# Patient Record
Sex: Female | Born: 1975
Health system: Southern US, Community
[De-identification: ages and names within clinical notes are randomized; demographics above are authoritative.]

## PROBLEM LIST (undated history)

## (undated) DIAGNOSIS — M5430 Sciatica, unspecified side: Secondary | ICD-10-CM

## (undated) DIAGNOSIS — F419 Anxiety disorder, unspecified: Secondary | ICD-10-CM

## (undated) DIAGNOSIS — E119 Type 2 diabetes mellitus without complications: Secondary | ICD-10-CM

## (undated) DIAGNOSIS — G43909 Migraine, unspecified, not intractable, without status migrainosus: Secondary | ICD-10-CM

## (undated) HISTORY — PX: CHOLECYSTECTOMY: SHX55

## (undated) HISTORY — PX: TONSILLECTOMY: SUR1361

## (undated) HISTORY — PX: ORTHOPEDIC SURGERY: SHX850

## (undated) HISTORY — PX: NOVASURE ABLATION: SHX5394

## (undated) HISTORY — PX: APPENDECTOMY: SHX54

---

## 1998-10-23 ENCOUNTER — Ambulatory Visit (HOSPITAL_COMMUNITY): Admission: RE | Admit: 1998-10-23 | Discharge: 1998-10-23 | Payer: Self-pay | Admitting: *Deleted

## 1998-11-19 ENCOUNTER — Inpatient Hospital Stay (HOSPITAL_COMMUNITY): Admission: AD | Admit: 1998-11-19 | Discharge: 1998-11-19 | Payer: Self-pay | Admitting: *Deleted

## 1998-11-23 ENCOUNTER — Ambulatory Visit (HOSPITAL_COMMUNITY): Admission: RE | Admit: 1998-11-23 | Discharge: 1998-11-23 | Payer: Self-pay | Admitting: *Deleted

## 1998-12-02 ENCOUNTER — Inpatient Hospital Stay (HOSPITAL_COMMUNITY): Admission: AD | Admit: 1998-12-02 | Discharge: 1998-12-04 | Payer: Self-pay | Admitting: Obstetrics

## 1998-12-09 ENCOUNTER — Inpatient Hospital Stay (HOSPITAL_COMMUNITY): Admission: AD | Admit: 1998-12-09 | Discharge: 1998-12-09 | Payer: Self-pay | Admitting: Obstetrics

## 1999-02-06 ENCOUNTER — Inpatient Hospital Stay (HOSPITAL_COMMUNITY): Admission: AD | Admit: 1999-02-06 | Discharge: 1999-02-06 | Payer: Self-pay | Admitting: *Deleted

## 1999-03-03 ENCOUNTER — Ambulatory Visit (HOSPITAL_COMMUNITY): Admission: RE | Admit: 1999-03-03 | Discharge: 1999-03-03 | Payer: Self-pay | Admitting: Obstetrics

## 1999-03-24 ENCOUNTER — Ambulatory Visit (HOSPITAL_COMMUNITY): Admission: RE | Admit: 1999-03-24 | Discharge: 1999-03-24 | Payer: Self-pay | Admitting: Obstetrics

## 1999-04-20 ENCOUNTER — Inpatient Hospital Stay (HOSPITAL_COMMUNITY): Admission: RE | Admit: 1999-04-20 | Discharge: 1999-04-20 | Payer: Self-pay | Admitting: Obstetrics

## 1999-04-23 ENCOUNTER — Inpatient Hospital Stay (HOSPITAL_COMMUNITY): Admission: AD | Admit: 1999-04-23 | Discharge: 1999-04-26 | Payer: Self-pay | Admitting: *Deleted

## 2000-08-23 ENCOUNTER — Encounter: Admission: RE | Admit: 2000-08-23 | Discharge: 2000-08-23 | Payer: Self-pay | Admitting: Family Medicine

## 2000-09-11 ENCOUNTER — Encounter: Admission: RE | Admit: 2000-09-11 | Discharge: 2000-09-19 | Payer: Self-pay | Admitting: Family Medicine

## 2001-06-13 ENCOUNTER — Emergency Department (HOSPITAL_COMMUNITY): Admission: EM | Admit: 2001-06-13 | Discharge: 2001-06-14 | Payer: Self-pay | Admitting: *Deleted

## 2001-06-14 ENCOUNTER — Encounter: Payer: Self-pay | Admitting: Emergency Medicine

## 2003-01-17 ENCOUNTER — Emergency Department (HOSPITAL_COMMUNITY): Admission: EM | Admit: 2003-01-17 | Discharge: 2003-01-17 | Payer: Self-pay | Admitting: Emergency Medicine

## 2003-01-17 ENCOUNTER — Encounter: Payer: Self-pay | Admitting: Emergency Medicine

## 2004-04-28 ENCOUNTER — Other Ambulatory Visit: Admission: RE | Admit: 2004-04-28 | Discharge: 2004-04-28 | Payer: Self-pay | Admitting: Obstetrics and Gynecology

## 2004-11-18 ENCOUNTER — Other Ambulatory Visit: Admission: RE | Admit: 2004-11-18 | Discharge: 2004-11-18 | Payer: Self-pay | Admitting: Obstetrics and Gynecology

## 2005-04-29 ENCOUNTER — Other Ambulatory Visit: Admission: RE | Admit: 2005-04-29 | Discharge: 2005-04-29 | Payer: Self-pay | Admitting: Obstetrics and Gynecology

## 2005-08-31 ENCOUNTER — Encounter: Admission: RE | Admit: 2005-08-31 | Discharge: 2005-08-31 | Payer: Self-pay | Admitting: Internal Medicine

## 2005-09-20 ENCOUNTER — Emergency Department (HOSPITAL_COMMUNITY): Admission: EM | Admit: 2005-09-20 | Discharge: 2005-09-20 | Payer: Self-pay | Admitting: Emergency Medicine

## 2005-11-09 ENCOUNTER — Other Ambulatory Visit: Admission: RE | Admit: 2005-11-09 | Discharge: 2005-11-09 | Payer: Self-pay | Admitting: Obstetrics and Gynecology

## 2005-12-28 ENCOUNTER — Encounter: Admission: RE | Admit: 2005-12-28 | Discharge: 2005-12-28 | Payer: Self-pay | Admitting: Family Medicine

## 2006-05-16 ENCOUNTER — Other Ambulatory Visit: Admission: RE | Admit: 2006-05-16 | Discharge: 2006-05-16 | Payer: Self-pay | Admitting: Obstetrics and Gynecology

## 2008-01-04 ENCOUNTER — Inpatient Hospital Stay (HOSPITAL_COMMUNITY): Admission: RE | Admit: 2008-01-04 | Discharge: 2008-01-06 | Payer: Self-pay | Admitting: Obstetrics and Gynecology

## 2009-12-15 ENCOUNTER — Encounter: Admission: RE | Admit: 2009-12-15 | Discharge: 2010-03-15 | Payer: Self-pay | Admitting: Obstetrics and Gynecology

## 2010-05-09 ENCOUNTER — Inpatient Hospital Stay (HOSPITAL_COMMUNITY): Admission: AD | Admit: 2010-05-09 | Discharge: 2010-05-11 | Payer: Self-pay | Admitting: Obstetrics and Gynecology

## 2010-08-18 ENCOUNTER — Emergency Department (HOSPITAL_BASED_OUTPATIENT_CLINIC_OR_DEPARTMENT_OTHER): Admission: EM | Admit: 2010-08-18 | Discharge: 2010-08-18 | Payer: Self-pay | Admitting: Emergency Medicine

## 2010-08-18 ENCOUNTER — Ambulatory Visit: Payer: Self-pay | Admitting: Diagnostic Radiology

## 2011-02-24 LAB — PREGNANCY, URINE: Preg Test, Ur: NEGATIVE

## 2011-02-24 LAB — CBC
HCT: 40.4 % (ref 36.0–46.0)
MCH: 29.7 pg (ref 26.0–34.0)
MCV: 86.5 fL (ref 78.0–100.0)
Platelets: 238 10*3/uL (ref 150–400)
RDW: 12.3 % (ref 11.5–15.5)
WBC: 8.6 10*3/uL (ref 4.0–10.5)

## 2011-02-24 LAB — URINALYSIS, ROUTINE W REFLEX MICROSCOPIC
Glucose, UA: NEGATIVE mg/dL
Ketones, ur: NEGATIVE mg/dL
Protein, ur: NEGATIVE mg/dL
Urobilinogen, UA: 0.2 mg/dL (ref 0.0–1.0)

## 2011-02-24 LAB — URINE CULTURE: Colony Count: 50000

## 2011-02-24 LAB — DIFFERENTIAL
Basophils Absolute: 0.1 10*3/uL (ref 0.0–0.1)
Eosinophils Absolute: 0.2 10*3/uL (ref 0.0–0.7)
Eosinophils Relative: 2 % (ref 0–5)
Lymphocytes Relative: 31 % (ref 12–46)
Lymphs Abs: 2.7 10*3/uL (ref 0.7–4.0)
Monocytes Absolute: 0.8 10*3/uL (ref 0.1–1.0)

## 2011-02-24 LAB — URINE MICROSCOPIC-ADD ON

## 2011-02-28 LAB — GLUCOSE, RANDOM: Glucose, Bld: 107 mg/dL — ABNORMAL HIGH (ref 70–99)

## 2011-02-28 LAB — CBC
HCT: 34.6 % — ABNORMAL LOW (ref 36.0–46.0)
Hemoglobin: 11.9 g/dL — ABNORMAL LOW (ref 12.0–15.0)
Hemoglobin: 14.4 g/dL (ref 12.0–15.0)
RBC: 3.78 MIL/uL — ABNORMAL LOW (ref 3.87–5.11)
RBC: 4.54 MIL/uL (ref 3.87–5.11)
WBC: 14.1 10*3/uL — ABNORMAL HIGH (ref 4.0–10.5)

## 2011-02-28 LAB — RPR: RPR Ser Ql: NONREACTIVE

## 2011-04-26 NOTE — Discharge Summary (Signed)
Margaret Wright, Margaret Wright          ACCOUNT NO.:  [id number]   MEDICAL RECORD NO.:  [id number]          PATIENT TYPE:  INP   LOCATION:  9131                          FACILITY:  [facility]   PHYSICIAN:  Zenaida Niece, M.D.DATE OF BIRTH:  Apr 02, 1976   DATE OF ADMISSION:  01/04/2008  DATE OF DISCHARGE:  01/06/2008                               DISCHARGE SUMMARY   ADMISSION DIAGNOSES:  1. Intrauterine pregnancy at 39 weeks.  2. Possible pregnancy-induced hypertension.  3. Obesity.   DISCHARGE DIAGNOSES:  1. Intrauterine pregnancy at 39 weeks.  2. Possible pregnancy-induced hypertension.  3. Fetal macrosomia.  4. Obesity.   PROCEDURES:  On January 23, she had a spontaneous vaginal delivery.   HISTORY AND PHYSICAL:  This is a 35 year old white female, gravida 3,  para 2-0-0-2 with an EGA of 39+ weeks who was admitted for elective  induction.  Prenatal care complicated by back pain, treated with  Flexeril and Darvocet, and recent slightly elevated blood pressures with  normal labs.   PRENATAL LABORATORY DATA:  Blood type is O+ with a negative antibody  screen, RPR nonreactive, rubella immune, hepatitis B surface antigen  negative, HIV negative, gonorrhea and chlamydia negative.  TSH is  normal. One-hour Glucola 193, GTT 79, 126, 169, 107; and group B strep  is negative.   PAST OB HISTORY:  Two vaginal deliveries at term.  The first baby  weighed 10 pounds 6 ounces and had a shoulder dystocia. The second baby  weighed 8 pounds 13 ounces without complications.   PAST MEDICAL HISTORY:  1. Asthma.  2. Remote history of hypothyroidism.  3. Anxiety.  4. Migraines.  5. Back pain.  6. Constipation.   PAST SURGICAL HISTORY:  1. Right knee surgery.  2. Left wrist surgery.  3. Tonsillectomy.  4. Appendectomy.  5. Cholecystectomy.   ALLERGIES:  ASPIRIN.   CURRENT MEDICATIONS:  Flexeril, Darvocet, and albuterol.   PHYSICAL EXAMINATION:  VITAL SIGNS:  She is afebrile with stable  vital  signs. Blood pressure on admission is normal. Weight is approximately  320 pounds.  Fetal heart tracing is reactive with irregular  contractions.  ABDOMEN:  Gravid, nontender with an estimated fetal weight of 8-9  pounds.  PELVIC:  Cervix is 2-3, 30, -3, vertex presentation.   HOSPITAL COURSE:  The patient was admitted and put on Pitocin for  induction.  She progressed into labor and received an epidural.  She  progressed to 4 cm at which time she had amniotomy which revealed clear  fluid.  She progressed to complete, pushed well, and on the evening of  January 23 had a vaginal delivery of a viable female infant with Apgars of  8/9, weight 10 pounds 6 ounces.  There was no significant shoulder  dystocia.  Placenta delivered spontaneously and was intact with a three-  vessel cord and was sent for cord blood collection.  She had a second-  degree laceration repaired with 3-0 Vicryl with local block, and  estimated blood loss was 500 mL.  Postpartum, she had no significant  complications.  Predelivery hemoglobin 13.1, postdelivery 11.7.  On  postoperative #2, she  was felt to be stable enough for discharge home.   DISCHARGE INSTRUCTIONS:  1. Regular diet.  2. Pelvic rest.  3. Follow up in 6 weeks.   DISCHARGE MEDICATIONS:  1. Percocet #20, one to two p.o. q. 4-6 h p.r.n. pain.  2. Over-the-counter ibuprofen as needed.   She is given our discharge pamphlet.      Zenaida Niece, M.D.  Electronically Signed     TDM/MEDQ  D:  01/06/2008  T:  01/06/2008  Job:  [id number]

## 2011-09-02 LAB — CBC
HCT: 33.5 — ABNORMAL LOW
HCT: 38
Hemoglobin: 13.1
MCHC: 34.6
MCHC: 35
MCV: 87
MCV: 87.6
Platelets: 182
RBC: 4.34
RDW: 13
RDW: 13.1

## 2013-01-22 ENCOUNTER — Other Ambulatory Visit: Payer: Self-pay | Admitting: Sports Medicine

## 2013-01-22 DIAGNOSIS — M5430 Sciatica, unspecified side: Secondary | ICD-10-CM

## 2013-01-22 DIAGNOSIS — M545 Low back pain, unspecified: Secondary | ICD-10-CM

## 2013-01-25 ENCOUNTER — Other Ambulatory Visit: Payer: Self-pay

## 2013-01-26 ENCOUNTER — Ambulatory Visit
Admission: RE | Admit: 2013-01-26 | Discharge: 2013-01-26 | Disposition: A | Discharge status: Home / Self Care | Payer: Federal, State, Local not specified - PPO | Source: Ambulatory Visit | Attending: Sports Medicine | Admitting: Sports Medicine

## 2013-01-26 DIAGNOSIS — M545 Low back pain, unspecified: Secondary | ICD-10-CM

## 2013-01-26 DIAGNOSIS — M5430 Sciatica, unspecified side: Secondary | ICD-10-CM

## 2013-07-09 ENCOUNTER — Other Ambulatory Visit: Payer: Self-pay | Admitting: Nurse Practitioner

## 2014-02-25 ENCOUNTER — Emergency Department (HOSPITAL_BASED_OUTPATIENT_CLINIC_OR_DEPARTMENT_OTHER): Payer: Federal, State, Local not specified - PPO

## 2014-02-25 ENCOUNTER — Encounter (HOSPITAL_BASED_OUTPATIENT_CLINIC_OR_DEPARTMENT_OTHER): Payer: Self-pay | Admitting: Emergency Medicine

## 2014-02-25 ENCOUNTER — Emergency Department (HOSPITAL_BASED_OUTPATIENT_CLINIC_OR_DEPARTMENT_OTHER)
Admission: EM | Admit: 2014-02-25 | Discharge: 2014-02-25 | Disposition: A | Discharge status: Home / Self Care | Payer: Federal, State, Local not specified - PPO | Attending: Emergency Medicine | Admitting: Emergency Medicine

## 2014-02-25 DIAGNOSIS — F411 Generalized anxiety disorder: Secondary | ICD-10-CM | POA: Insufficient documentation

## 2014-02-25 DIAGNOSIS — Z8679 Personal history of other diseases of the circulatory system: Secondary | ICD-10-CM | POA: Insufficient documentation

## 2014-02-25 DIAGNOSIS — IMO0002 Reserved for concepts with insufficient information to code with codable children: Secondary | ICD-10-CM | POA: Insufficient documentation

## 2014-02-25 DIAGNOSIS — Y929 Unspecified place or not applicable: Secondary | ICD-10-CM | POA: Insufficient documentation

## 2014-02-25 DIAGNOSIS — Z79899 Other long term (current) drug therapy: Secondary | ICD-10-CM | POA: Insufficient documentation

## 2014-02-25 DIAGNOSIS — W010XXA Fall on same level from slipping, tripping and stumbling without subsequent striking against object, initial encounter: Secondary | ICD-10-CM | POA: Insufficient documentation

## 2014-02-25 DIAGNOSIS — E119 Type 2 diabetes mellitus without complications: Secondary | ICD-10-CM | POA: Insufficient documentation

## 2014-02-25 DIAGNOSIS — M5416 Radiculopathy, lumbar region: Secondary | ICD-10-CM

## 2014-02-25 DIAGNOSIS — Y939 Activity, unspecified: Secondary | ICD-10-CM | POA: Insufficient documentation

## 2014-02-25 HISTORY — DX: Migraine, unspecified, not intractable, without status migrainosus: G43.909

## 2014-02-25 HISTORY — DX: Type 2 diabetes mellitus without complications: E11.9

## 2014-02-25 HISTORY — DX: Anxiety disorder, unspecified: F41.9

## 2014-02-25 MED ORDER — HYDROCODONE-ACETAMINOPHEN 5-325 MG PO TABS: 1.0000 | ORAL_TABLET | Freq: Four times a day (QID) | ORAL | Status: DC | PRN

## 2014-02-25 MED ORDER — HYDROMORPHONE HCL PF 2 MG/ML IJ SOLN
2.0000 mg | Freq: Once | INTRAMUSCULAR | Status: AC
  Administered 2014-02-25: 2 mg via INTRAMUSCULAR
  Filled 2014-02-25: qty 1

## 2014-02-25 NOTE — ED Notes (Signed)
Mopping slipped on wet floor  Fell    Back pain

## 2014-02-25 NOTE — ED Provider Notes (Signed)
CSN: [id number]     Arrival date & time 02/25/14  0100 History   First MD Initiated Contact with Patient 02/25/14 0249     Chief Complaint  Patient presents with  . Back Injury     (Consider location/radiation/quality/duration/timing/severity/associated sxs/prior Treatment) HPI This is a 38 year old female who was mopping the floor about 10 PM yesterday evening and slipped on a wet spot. She fell injuring her back. She is having moderate to severe pain in her right lumbar region radiating down her right leg and about the L3 dermatome. There is no associated numbness or weakness. The pain is worse with movement of the right hip. She denies other injury.  Past Medical History  Diagnosis Date  . Diabetes mellitus without complication   . Anxiety   . Migraines    Past Surgical History  Procedure Laterality Date  . Tonsillectomy    . Appendectomy    . Cholecystectomy    . Orthopedic surgery     No family history on file. History  Substance Use Topics  . Smoking status: Never Smoker   . Smokeless tobacco: Not on file  . Alcohol Use: Yes     Comment: socially   OB History   Grav Para Term Preterm Abortions TAB SAB Ect Mult Living                 Review of Systems  All other systems reviewed and are negative.   Allergies  Aspirin  Home Medications   Current Outpatient Rx  Name  Route  Sig  Dispense  Refill  . ALPRAZolam (XANAX) 0.5 MG tablet   Oral   Take 0.5 mg by mouth at bedtime as needed for anxiety.         Marland Kitchen. buPROPion (WELLBUTRIN XL) 300 MG 24 hr tablet   Oral   Take 300 mg by mouth daily.         . butalbital-acetaminophen-caffeine (FIORICET WITH CODEINE) 50-325-40-30 MG per capsule   Oral   Take 1 capsule by mouth every 4 (four) hours as needed for headache.         . metFORMIN (GLUCOPHAGE) 500 MG tablet   Oral   Take 500 mg by mouth 2 (two) times daily with a meal.         . methocarbamol (ROBAXIN) 750 MG tablet   Oral   Take 750 mg by  mouth 4 (four) times daily.         . sertraline (ZOLOFT) 100 MG tablet   Oral   Take 150 mg by mouth daily.          BP 136/74  Pulse 86  Temp(Src) 98.3 F (36.8 C) (Oral)  Resp 20  Ht 5\' 8"  (1.727 m)  Wt 289 lb (131.09 kg)  BMI 43.95 kg/m2  SpO2 100%  Physical Exam General: Well-developed, obese female in no acute distress; appearance consistent with age of record HENT: normocephalic; atraumatic Eyes: pupils equal, round and reactive to light; extraocular muscles intact Neck: supple Heart: regular rate and rhythm; no murmurs, rubs or gallops Lungs: clear to auscultation bilaterally Abdomen: soft; nondistended Back: Right lower back tenderness; pain on straight leg raise on the right at about 30 Extremities: No deformity; full range of motion except for right hip due to pain; pulses normal Neurologic: Awake, alert and oriented; motor function intact in all extremities and symmetric; no facial droop; sensation intact and equal in lower extremities Skin: Warm and dry Psychiatric: Mildly anxious  ED Course  Procedures (including critical care time)   MDM  Nursing notes and vitals signs, including pulse oximetry, reviewed.  Summary of this visit's results, reviewed by myself:  Labs:  No results found for this or any previous visit (from the past 24 hour(s)).  Imaging Studies: Dg Lumbar Spine Complete  02/25/2014   CLINICAL DATA:  Injury.  Mid and low back pain.  EXAM: LUMBAR SPINE - COMPLETE 4+ VIEW  COMPARISON:  MRI lumbar spine 01/26/2013.  FINDINGS: Vertebral body height and alignment are normal. Intervertebral disc space height is maintained. Cholecystectomy clips are noted.  IMPRESSION: Negative exam.   Electronically Signed   By: Drusilla Kanner M.D.   On: 02/25/2014 03:40       Hanley Seamen, MD 02/25/14 [phone]

## 2014-02-25 NOTE — ED Notes (Signed)
Slipped on wet floor  C/o pain from mid to lower back  Radiating to rt hip into rt leg  No loc

## 2014-04-25 ENCOUNTER — Encounter (HOSPITAL_BASED_OUTPATIENT_CLINIC_OR_DEPARTMENT_OTHER): Payer: Self-pay | Admitting: Emergency Medicine

## 2014-04-25 ENCOUNTER — Emergency Department (HOSPITAL_BASED_OUTPATIENT_CLINIC_OR_DEPARTMENT_OTHER)
Admission: EM | Admit: 2014-04-25 | Discharge: 2014-04-25 | Disposition: A | Discharge status: Home / Self Care | Payer: Federal, State, Local not specified - PPO | Attending: Emergency Medicine | Admitting: Emergency Medicine

## 2014-04-25 DIAGNOSIS — F411 Generalized anxiety disorder: Secondary | ICD-10-CM | POA: Insufficient documentation

## 2014-04-25 DIAGNOSIS — Z888 Allergy status to other drugs, medicaments and biological substances status: Secondary | ICD-10-CM | POA: Insufficient documentation

## 2014-04-25 DIAGNOSIS — E119 Type 2 diabetes mellitus without complications: Secondary | ICD-10-CM | POA: Insufficient documentation

## 2014-04-25 DIAGNOSIS — M5430 Sciatica, unspecified side: Secondary | ICD-10-CM

## 2014-04-25 DIAGNOSIS — G43909 Migraine, unspecified, not intractable, without status migrainosus: Secondary | ICD-10-CM

## 2014-04-25 DIAGNOSIS — G43009 Migraine without aura, not intractable, without status migrainosus: Secondary | ICD-10-CM | POA: Insufficient documentation

## 2014-04-25 DIAGNOSIS — Z79899 Other long term (current) drug therapy: Secondary | ICD-10-CM | POA: Insufficient documentation

## 2014-04-25 HISTORY — DX: Sciatica, unspecified side: M54.30

## 2014-04-25 MED ORDER — HYDROCODONE-ACETAMINOPHEN 5-325 MG PO TABS: 1.0000 | ORAL_TABLET | Freq: Four times a day (QID) | ORAL | Status: DC | PRN

## 2014-04-25 MED ORDER — KETOROLAC TROMETHAMINE 60 MG/2ML IM SOLN
60.0000 mg | Freq: Once | INTRAMUSCULAR | Status: AC
  Administered 2014-04-25: 60 mg via INTRAMUSCULAR
  Filled 2014-04-25: qty 2

## 2014-04-25 MED ORDER — ONDANSETRON 8 MG PO TBDP
8.0000 mg | ORAL_TABLET | Freq: Once | ORAL | Status: AC
Start: 2014-04-25 — End: 2014-04-25
  Administered 2014-04-25: 8 mg via ORAL
  Filled 2014-04-25: qty 1

## 2014-04-25 NOTE — ED Notes (Addendum)
Pt reports migraine HA that began yesterday approx midnight - pt has hx of migraines and has taken her fioricet w/o relief. Pt admits to nausea, denies vomiting, fever or stiff neck. Pt also w/ photophobia. Lights dimmed for pt comfort.

## 2014-04-25 NOTE — ED Notes (Signed)
Pt ambulating independently w/ steady gait on d/c in no acute distress, A&Ox4. D/c instructions reviewed w/ pt and family - pt and family deny any further questions or concerns at present. Rx given x1  

## 2014-04-25 NOTE — ED Provider Notes (Signed)
CSN: [id number]     Arrival date & time 04/25/14  0012 History   First MD Initiated Contact with Patient 04/25/14 [phone]     Chief Complaint  Patient presents with  . Migraine     (Consider location/radiation/quality/duration/timing/severity/associated sxs/prior Treatment) HPI This is a 38 year old female with a history of sciatica migraines. Her sciatica started "acting up" 3 days ago. She believes that that triggered a migraine. The onset was about 28 hours ago. She has taken four Fioricet capsules without relief. Her pain on arrival was 10 out of 10 but is now about 8/10. It is located on the right side of her head, is described as throbbing and is like previous migraines. There is associated photophobia and nausea but no vomiting. There is no focal neurologic deficits. She does have blurred vision.  Past Medical History  Diagnosis Date  . Diabetes mellitus without complication   . Anxiety   . Migraines    Past Surgical History  Procedure Laterality Date  . Tonsillectomy    . Appendectomy    . Cholecystectomy    . Orthopedic surgery     No family history on file. History  Substance Use Topics  . Smoking status: Never Smoker   . Smokeless tobacco: Not on file  . Alcohol Use: Yes     Comment: socially   OB History   Grav Para Term Preterm Abortions TAB SAB Ect Mult Living                 Review of Systems  All other systems reviewed and are negative.   Allergies  Aspirin  Home Medications   Prior to Admission medications   Medication Sig Start Date End Date Taking? Authorizing Provider  ALPRAZolam Prudy Feeler(XANAX) 0.5 MG tablet Take 0.5 mg by mouth at bedtime as needed for anxiety.    Historical Provider, MD  buPROPion (WELLBUTRIN XL) 300 MG 24 hr tablet Take 300 mg by mouth daily.    Historical Provider, MD  butalbital-acetaminophen-caffeine (FIORICET WITH CODEINE) 50-325-40-30 MG per capsule Take 1 capsule by mouth every 4 (four) hours as needed for headache.    Historical  Provider, MD  HYDROcodone-acetaminophen (NORCO/VICODIN) 5-325 MG per tablet Take 1-2 tablets by mouth every 6 (six) hours as needed (for pain). 02/25/14   Carlisle BeersJohn L Gissel Keilman, MD  metFORMIN (GLUCOPHAGE) 500 MG tablet Take 500 mg by mouth 2 (two) times daily with a meal.    Historical Provider, MD  methocarbamol (ROBAXIN) 750 MG tablet Take 750 mg by mouth 4 (four) times daily.    Historical Provider, MD  sertraline (ZOLOFT) 100 MG tablet Take 150 mg by mouth daily.    Historical Provider, MD   BP 124/76  Pulse 68  Temp(Src) 98 F (36.7 C) (Oral)  Resp 16  SpO2 100%  Physical Exam General: Well-developed, well-nourished female in no acute distress; appearance consistent with age of record HENT: normocephalic; atraumatic Eyes: pupils equal, round and reactive to light; extraocular muscles intact; photophobia Neck: supple Heart: regular rate and rhythm Lungs: clear to auscultation bilaterally Abdomen: soft; nondistended Back: Pain on movement of lower back Extremities: No deformity; full range of motion Neurologic: Sleeping but readily aroused; motor function intact in all extremities and symmetric; no facial droop Skin: Warm and dry Psychiatric: Normal mood and affect    ED Course  Procedures (including critical care time)  MDM  4:19 AM Patient's headache significantly improved after IM Toradol.    Hanley SeamenJohn L Zymier Rodgers, MD 04/25/14 [phone]

## 2014-04-25 NOTE — ED Notes (Signed)
C/o migraine x 1 day with photosensitivity, blurry vision and nausea. Pain 10/10. Believes the migraine was triggered by her sciatica flare up

## 2014-06-26 ENCOUNTER — Emergency Department (HOSPITAL_BASED_OUTPATIENT_CLINIC_OR_DEPARTMENT_OTHER)
Admission: EM | Admit: 2014-06-26 | Discharge: 2014-06-26 | Disposition: A | Discharge status: Home / Self Care | Payer: Federal, State, Local not specified - PPO | Attending: Emergency Medicine | Admitting: Emergency Medicine

## 2014-06-26 ENCOUNTER — Encounter (HOSPITAL_BASED_OUTPATIENT_CLINIC_OR_DEPARTMENT_OTHER): Payer: Self-pay | Admitting: Emergency Medicine

## 2014-06-26 DIAGNOSIS — F17200 Nicotine dependence, unspecified, uncomplicated: Secondary | ICD-10-CM | POA: Insufficient documentation

## 2014-06-26 DIAGNOSIS — G43909 Migraine, unspecified, not intractable, without status migrainosus: Secondary | ICD-10-CM | POA: Insufficient documentation

## 2014-06-26 DIAGNOSIS — Z79899 Other long term (current) drug therapy: Secondary | ICD-10-CM | POA: Insufficient documentation

## 2014-06-26 DIAGNOSIS — F411 Generalized anxiety disorder: Secondary | ICD-10-CM | POA: Insufficient documentation

## 2014-06-26 DIAGNOSIS — B958 Unspecified staphylococcus as the cause of diseases classified elsewhere: Secondary | ICD-10-CM | POA: Insufficient documentation

## 2014-06-26 DIAGNOSIS — L089 Local infection of the skin and subcutaneous tissue, unspecified: Secondary | ICD-10-CM

## 2014-06-26 DIAGNOSIS — Z8739 Personal history of other diseases of the musculoskeletal system and connective tissue: Secondary | ICD-10-CM | POA: Insufficient documentation

## 2014-06-26 DIAGNOSIS — E119 Type 2 diabetes mellitus without complications: Secondary | ICD-10-CM | POA: Insufficient documentation

## 2014-06-26 MED ORDER — MUPIROCIN CALCIUM 2 % EX CREA
TOPICAL_CREAM | Freq: Once | CUTANEOUS | Status: AC
  Administered 2014-06-26: 03:00:00 via TOPICAL
  Filled 2014-06-26: qty 15

## 2014-06-26 MED ORDER — DOXYCYCLINE HYCLATE 100 MG PO CAPS: 100.0000 mg | ORAL_CAPSULE | Freq: Two times a day (BID) | ORAL | Status: DC

## 2014-06-26 MED ORDER — FENTANYL CITRATE 0.05 MG/ML IJ SOLN
100.0000 ug | Freq: Once | INTRAMUSCULAR | Status: AC
  Administered 2014-06-26: 100 ug via INTRAVENOUS
  Filled 2014-06-26: qty 2

## 2014-06-26 MED ORDER — DOXYCYCLINE HYCLATE 100 MG PO TABS
100.0000 mg | ORAL_TABLET | Freq: Once | ORAL | Status: AC
  Administered 2014-06-26: 100 mg via ORAL
  Filled 2014-06-26: qty 1

## 2014-06-26 MED ORDER — HYDROCODONE-IBUPROFEN 7.5-200 MG PO TABS: 1.0000 | ORAL_TABLET | Freq: Four times a day (QID) | ORAL | Status: DC | PRN

## 2014-06-26 MED ORDER — VANCOMYCIN HCL IN DEXTROSE 1-5 GM/200ML-% IV SOLN
1000.0000 mg | Freq: Once | INTRAVENOUS | Status: AC
Start: 2014-06-26 — End: 2014-06-26
  Administered 2014-06-26: 1000 mg via INTRAVENOUS
  Filled 2014-06-26: qty 200

## 2014-06-26 NOTE — ED Provider Notes (Signed)
CSN: [id number]     Arrival date & time 06/26/14  0231 History   First MD Initiated Contact with Patient 06/26/14 0302     Chief Complaint  Patient presents with  . Wound Infection     (Consider location/radiation/quality/duration/timing/severity/associated sxs/prior Treatment) HPI This is a 38 year old female who had a tattoo to her left lateral thigh done 2 weeks ago. About 5 days ago she believes she was bitten by insects on her legs and has subsequently developed pustules around the new tattoo. She has attempted to drain the pustule herself the patient continued to be "very painful". There is some associated drainage. She bumped her left leg earlier, startling herself, which caused her to twist it exacerbated her chronic right sciatica.  Past Medical History  Diagnosis Date  . Diabetes mellitus without complication   . Anxiety   . Migraines   . Sciatica    Past Surgical History  Procedure Laterality Date  . Tonsillectomy    . Appendectomy    . Cholecystectomy    . Orthopedic surgery    . Novasure ablation     No family history on file. History  Substance Use Topics  . Smoking status: Current Every Day Smoker -- 1.00 packs/day  . Smokeless tobacco: Not on file  . Alcohol Use: Yes     Comment: socially   OB History   Grav Para Term Preterm Abortions TAB SAB Ect Mult Living                 Review of Systems  All other systems reviewed and are negative.   Allergies  Aspirin and Morphine and related  Home Medications   Prior to Admission medications   Medication Sig Start Date End Date Taking? Authorizing Provider  gabapentin (NEURONTIN) 100 MG capsule Take 100 mg by mouth 3 (three) times daily.   Yes Historical Provider, MD  ALPRAZolam Prudy Feeler(XANAX) 0.5 MG tablet Take 0.5 mg by mouth at bedtime as needed for anxiety.    Historical Provider, MD  buPROPion (WELLBUTRIN XL) 300 MG 24 hr tablet Take 300 mg by mouth daily.    Historical Provider, MD    butalbital-acetaminophen-caffeine (FIORICET WITH CODEINE) 50-325-40-30 MG per capsule Take 1 capsule by mouth every 4 (four) hours as needed for headache.    Historical Provider, MD  HYDROcodone-acetaminophen (NORCO/VICODIN) 5-325 MG per tablet Take 1-2 tablets by mouth every 6 (six) hours as needed (for pain). 04/25/14   Carlisle BeersJohn L Taneshia Lorence, MD  metFORMIN (GLUCOPHAGE) 500 MG tablet Take 500 mg by mouth 2 (two) times daily with a meal.    Historical Provider, MD  methocarbamol (ROBAXIN) 750 MG tablet Take 750 mg by mouth 4 (four) times daily.    Historical Provider, MD  sertraline (ZOLOFT) 100 MG tablet Take 150 mg by mouth daily.    Historical Provider, MD   BP 144/72  Pulse 98  Temp(Src) 99.2 F (37.3 C) (Oral)  Resp 20  Ht 5\' 8"  (1.727 m)  Wt 297 lb (134.718 kg)  BMI 45.17 kg/m2  SpO2 98%  Physical Exam General: Well-developed, well-nourished female in no acute distress; appearance consistent with age of record HENT: normocephalic; atraumatic Eyes: pupils equal, round and reactive to light; extraocular muscles intact Neck: supple Heart: regular rate and rhythm Lungs: clear to auscultation bilaterally Abdomen: soft; nondistended Extremities: No deformity; full range of motion; pulses normal Neurologic: Awake, alert and oriented; motor function intact in all extremities and symmetric; no facial droop Skin: Warm and dry; tattoo left  lateral calf with surrounding tender pustules as shown:  Psychiatric: Normal mood and affect    ED Course  Procedures (including critical care time)  MDM  3:12 AM We'll give vancomycin 1 g in the ED and then sent out on topical mupirocin and oral doxycycline.    Hanley Seamen, MD 06/26/14 [phone]

## 2014-06-26 NOTE — ED Notes (Signed)
Pt got a new tatoo on left lower leg 2 weeks ago today.  Redness and severe pain around the area. Also twisted in such a way tonight to cause pain in her lower back.

## 2014-11-11 ENCOUNTER — Emergency Department (HOSPITAL_COMMUNITY)
Admission: EM | Admit: 2014-11-11 | Discharge: 2014-11-11 | Disposition: A | Discharge status: Home / Self Care | Payer: Federal, State, Local not specified - PPO | Attending: Emergency Medicine | Admitting: Emergency Medicine

## 2014-11-11 ENCOUNTER — Emergency Department (HOSPITAL_COMMUNITY): Payer: Federal, State, Local not specified - PPO

## 2014-11-11 ENCOUNTER — Encounter (HOSPITAL_COMMUNITY): Payer: Self-pay | Admitting: *Deleted

## 2014-11-11 DIAGNOSIS — G43909 Migraine, unspecified, not intractable, without status migrainosus: Secondary | ICD-10-CM | POA: Diagnosis not present

## 2014-11-11 DIAGNOSIS — F419 Anxiety disorder, unspecified: Secondary | ICD-10-CM | POA: Insufficient documentation

## 2014-11-11 DIAGNOSIS — R05 Cough: Secondary | ICD-10-CM | POA: Diagnosis present

## 2014-11-11 DIAGNOSIS — R059 Cough, unspecified: Secondary | ICD-10-CM

## 2014-11-11 DIAGNOSIS — J209 Acute bronchitis, unspecified: Secondary | ICD-10-CM

## 2014-11-11 DIAGNOSIS — Z79899 Other long term (current) drug therapy: Secondary | ICD-10-CM | POA: Diagnosis not present

## 2014-11-11 DIAGNOSIS — Z8739 Personal history of other diseases of the musculoskeletal system and connective tissue: Secondary | ICD-10-CM | POA: Insufficient documentation

## 2014-11-11 DIAGNOSIS — E119 Type 2 diabetes mellitus without complications: Secondary | ICD-10-CM | POA: Diagnosis not present

## 2014-11-11 DIAGNOSIS — Z720 Tobacco use: Secondary | ICD-10-CM | POA: Diagnosis not present

## 2014-11-11 DIAGNOSIS — Z7951 Long term (current) use of inhaled steroids: Secondary | ICD-10-CM | POA: Insufficient documentation

## 2014-11-11 DIAGNOSIS — M62838 Other muscle spasm: Secondary | ICD-10-CM | POA: Insufficient documentation

## 2014-11-11 DIAGNOSIS — H9209 Otalgia, unspecified ear: Secondary | ICD-10-CM | POA: Insufficient documentation

## 2014-11-11 MED ORDER — PREDNISONE 20 MG PO TABS: 20.0000 mg | ORAL_TABLET | Freq: Two times a day (BID) | ORAL | Status: DC

## 2014-11-11 MED ORDER — IPRATROPIUM BROMIDE 0.02 % IN SOLN: 0.5000 mg | Freq: Once | RESPIRATORY_TRACT | Status: DC

## 2014-11-11 MED ORDER — ALBUTEROL SULFATE (2.5 MG/3ML) 0.083% IN NEBU: 5.0000 mg | INHALATION_SOLUTION | Freq: Once | RESPIRATORY_TRACT | Status: DC

## 2014-11-11 MED ORDER — PREDNISONE 10 MG PO TABS
60.0000 mg | ORAL_TABLET | Freq: Once | ORAL | Status: AC
  Administered 2014-11-11: 60 mg via ORAL
  Filled 2014-11-11 (×2): qty 1

## 2014-11-11 MED ORDER — PREDNISONE 10 MG PO TABS: 10.0000 mg | ORAL_TABLET | Freq: Every day | ORAL | Status: DC

## 2014-11-11 MED ORDER — IPRATROPIUM-ALBUTEROL 0.5-2.5 (3) MG/3ML IN SOLN
3.0000 mL | Freq: Once | RESPIRATORY_TRACT | Status: AC
  Administered 2014-11-11: 3 mL via RESPIRATORY_TRACT
  Filled 2014-11-11: qty 3

## 2014-11-11 MED ORDER — ALBUTEROL SULFATE (2.5 MG/3ML) 0.083% IN NEBU
2.5000 mg | INHALATION_SOLUTION | Freq: Once | RESPIRATORY_TRACT | Status: AC
  Administered 2014-11-11: 2.5 mg via RESPIRATORY_TRACT
  Filled 2014-11-11: qty 3

## 2014-11-11 MED ORDER — HYDROCODONE-ACETAMINOPHEN 5-325 MG PO TABS
1.0000 | ORAL_TABLET | Freq: Once | ORAL | Status: AC
  Administered 2014-11-11: 1 via ORAL
  Filled 2014-11-11: qty 1

## 2014-11-11 NOTE — ED Notes (Signed)
Pt alert & oriented x4, stable gait. Patient given discharge instructions, paperwork & prescription(s). Patient stopped at the registration desk to finish any additional paperwork. Patient  verbalized understanding. Pt left department in wheelchair w/ no further questions.

## 2014-11-11 NOTE — ED Provider Notes (Signed)
CSN: [id number]     Arrival date & time 11/11/14  0025 History  This chart was scribe for Flint MelterElliott L Brandalynn Ofallon, MD by Angelene GiovanniEmmanuella Mensah, ED Scribe. The patient was seen in room APA06/[facility]06 and the patient's care was started at 12:51 AM.    Chief Complaint  Patient presents with  . Cough   The history is provided by the patient. No language interpreter was used.   HPI Comments: Margaret Wright is a 38 y.o. female who presents to the Emergency Department complaining of a non-productive cough for 2 days. She reports associated ear pain, CP when she breathes in, and nasal congestion. She states that she smokes. She reports a hx of asthma and she states that she has an inhaler but it has not been helping.    Past Medical History  Diagnosis Date  . Diabetes mellitus without complication   . Anxiety   . Migraines   . Sciatica    Past Surgical History  Procedure Laterality Date  . Tonsillectomy    . Appendectomy    . Cholecystectomy    . Orthopedic surgery    . Novasure ablation     No family history on file. History  Substance Use Topics  . Smoking status: Current Every Day Smoker -- 1.00 packs/day  . Smokeless tobacco: Not on file  . Alcohol Use: Yes     Comment: socially   OB History    No data available     Review of Systems  HENT: Positive for congestion.   All other systems reviewed and are negative.     Allergies  Aspirin and Morphine and related  Home Medications   Prior to Admission medications   Medication Sig Start Date End Date Taking? Authorizing Provider  albuterol (PROVENTIL HFA;VENTOLIN HFA) 108 (90 BASE) MCG/ACT inhaler Inhale into the lungs every 6 (six) hours as needed for wheezing or shortness of breath.   Yes Historical Provider, MD  ALPRAZolam Prudy Feeler(XANAX) 0.5 MG tablet Take 0.5 mg by mouth 2 (two) times daily.    Yes Historical Provider, MD  buPROPion (WELLBUTRIN XL) 300 MG 24 hr tablet Take 300 mg by mouth daily.   Yes Historical Provider, MD   butalbital-acetaminophen-caffeine (FIORICET WITH CODEINE) 50-325-40-30 MG per capsule Take 1 capsule by mouth every 4 (four) hours as needed for headache.   Yes Historical Provider, MD  carisoprodol (SOMA) 350 MG tablet Take 350 mg by mouth 4 (four) times daily.   Yes Historical Provider, MD  enalapril (VASOTEC) 2.5 MG tablet Take 2.5 mg by mouth daily.   Yes Historical Provider, MD  fluticasone (FLONASE) 50 MCG/ACT nasal spray Place into both nostrils daily.   Yes Historical Provider, MD  gabapentin (NEURONTIN) 100 MG capsule Take 100 mg by mouth 3 (three) times daily.   Yes Historical Provider, MD  HYDROcodone-acetaminophen (NORCO/VICODIN) 5-325 MG per tablet Take 1-2 tablets by mouth every 6 (six) hours as needed (for pain). 04/25/14  Yes John L Molpus, MD  metFORMIN (GLUCOPHAGE) 500 MG tablet Take 500 mg by mouth 2 (two) times daily with a meal.   Yes Historical Provider, MD  promethazine (PHENERGAN) 25 MG tablet Take 25 mg by mouth every 6 (six) hours as needed for nausea or vomiting.   Yes Historical Provider, MD  sertraline (ZOLOFT) 100 MG tablet Take 150 mg by mouth daily.   Yes Historical Provider, MD  doxycycline (VIBRAMYCIN) 100 MG capsule Take 1 capsule (100 mg total) by mouth 2 (two) times daily. 06/26/14  John L Molpus, MD  HYDROcodone-ibuprofen (VICOPROFEN) 7.5-200 MG per tablet Take 1 tablet by mouth every 6 (six) hours as needed (for pain). 06/26/14   John L Molpus, MD  methocarbamol (ROBAXIN) 750 MG tablet Take 750 mg by mouth 4 (four) times daily.    Historical Provider, MD   BP 141/81 mmHg  Pulse 89  Temp(Src) 99.2 F (37.3 C)  Resp 20  Ht 5\' 8"  (1.727 m)  Wt 286 lb (129.729 kg)  BMI 43.50 kg/m2  SpO2 100% Physical Exam  Constitutional: She appears well-developed and well-nourished.  Cardiovascular: Normal rate, regular rhythm and normal heart sounds.   Pulmonary/Chest: She has no wheezes. She has no rales.  Shallow aspirations  Nursing note and vitals reviewed.   ED  Course  Procedures (including critical care time) Medications - No data to display  Patient Vitals for the past 24 hrs:  BP Temp Pulse Resp SpO2 Height Weight  11/11/14 0036 141/81 mmHg 99.2 F (37.3 C) 89 20 100 % 5\' 8"  (1.727 m) 286 lb (129.729 kg)    12:54 AM- Pt advised of plan for treatment and pt agrees.   2:03 AM Reevaluation with update and discussion. After initial assessment and treatment, an updated evaluation reveals she states breathing is better but is having muscle aches and spasms. Norco ordered. Will observe 30 min., and see if needs another neb treatment. Brannan Cassedy L   02:45- She is feeling better, and is ready to go home. Findings discussed with pt., and husband.  Labs Review Labs Reviewed - No data to display  Imaging Review No results found.   EKG Interpretation None      MDM   Final diagnoses:  Cough  Acute bronchitis, unspecified organism  Tobacco abuse   Evaluation is consistent with bronchitis related to tobacco abuse.  She has a nonproductive cough, and has improved to her baseline with a single nebulizer treatment.   Nursing Notes Reviewed/ Care Coordinated Applicable Imaging Reviewed Interpretation of Laboratory Data incorporated into ED treatment  The patient appears reasonably screened and/or stabilized for discharge and I doubt any other medical condition or other [facility] requiring further screening, evaluation, or treatment in the ED at this time prior to discharge.  Plan: Home Medications- Prednisone; Home Treatments- Stop smoking; return here if the recommended treatment, does not improve the symptoms; Recommended follow up- PCP prn  I personally performed the services described in this documentation, which was scribed in my presence. The recorded information has been reviewed and is accurate.    Flint MelterElliott L Philip Kotlyar, MD 11/11/14 [phone]

## 2014-11-11 NOTE — ED Notes (Signed)
Pt ambulated to restroom & returned to room w/ no complications. 

## 2014-11-11 NOTE — Discharge Instructions (Signed)
Acute Bronchitis Bronchitis is inflammation of the airways that extend from the windpipe into the lungs (bronchi). The inflammation often causes mucus to develop. This leads to a cough, which is the most common symptom of bronchitis.  In acute bronchitis, the condition usually develops suddenly and goes away over time, usually in a couple weeks. Smoking, allergies, and asthma can make bronchitis worse. Repeated episodes of bronchitis may cause further lung problems.  CAUSES Acute bronchitis is most often caused by the same virus that causes a cold. The virus can spread from person to person (contagious) through coughing, sneezing, and touching contaminated objects. SIGNS AND SYMPTOMS   Cough.   Fever.   Coughing up mucus.   Body aches.   Chest congestion.   Chills.   Shortness of breath.   Sore throat.  DIAGNOSIS  Acute bronchitis is usually diagnosed through a physical exam. Your health care provider will also ask you questions about your medical history. Tests, such as chest X-rays, are sometimes done to rule out other conditions.  TREATMENT  Acute bronchitis usually goes away in a couple weeks. Oftentimes, no medical treatment is necessary. Medicines are sometimes given for relief of fever or cough. Antibiotic medicines are usually not needed but may be prescribed in certain situations. In some cases, an inhaler may be recommended to help reduce shortness of breath and control the cough. A cool mist vaporizer may also be used to help thin bronchial secretions and make it easier to clear the chest.  HOME CARE INSTRUCTIONS  Get plenty of rest.   Drink enough fluids to keep your urine clear or pale yellow (unless you have a medical condition that requires fluid restriction). Increasing fluids may help thin your respiratory secretions (sputum) and reduce chest congestion, and it will prevent dehydration.   Take medicines only as directed by your health care provider.  If  you were prescribed an antibiotic medicine, finish it all even if you start to feel better.  Avoid smoking and secondhand smoke. Exposure to cigarette smoke or irritating chemicals will make bronchitis worse. If you are a smoker, consider using nicotine gum or skin patches to help control withdrawal symptoms. Quitting smoking will help your lungs heal faster.   Reduce the chances of another bout of acute bronchitis by washing your hands frequently, avoiding people with cold symptoms, and trying not to touch your hands to your mouth, nose, or eyes.   Keep all follow-up visits as directed by your health care provider.  SEEK MEDICAL CARE IF: Your symptoms do not improve after 1 week of treatment.  SEEK IMMEDIATE MEDICAL CARE IF:  You develop an increased fever or chills.   You have chest pain.   You have severe shortness of breath.  You have bloody sputum.   You develop dehydration.  You faint or repeatedly feel like you are going to pass out.  You develop repeated vomiting.  You develop a severe headache. MAKE SURE YOU:   Understand these instructions.  Will watch your condition.  Will get help right away if you are not doing well or get worse. Document Released: 01/05/2005 Document Revised: 04/14/2014 Document Reviewed: 05/21/2013 [facility] Patient Information 2015 [place], [place]. This information is not intended to replace advice given to you by your health care provider. Make sure you discuss any questions you have with your health care provider.  Smoking Cessation, Tips for Success If you are ready to quit smoking, congratulations! You have chosen to help yourself be healthier. Cigarettes bring  nicotine, tar, carbon monoxide, and other irritants into your body. Your lungs, heart, and blood vessels will be able to work better without these poisons. There are many different ways to quit smoking. Nicotine gum, nicotine patches, a nicotine inhaler, or nicotine nasal spray can  help with physical craving. Hypnosis, support groups, and medicines help break the habit of smoking. WHAT THINGS CAN I DO TO MAKE QUITTING EASIER?  Here are some tips to help you quit for good:  Pick a date when you will quit smoking completely. Tell all of your friends and family about your plan to quit on that date.  Do not try to slowly cut down on the number of cigarettes you are smoking. Pick a quit date and quit smoking completely starting on that day.  Throw away all cigarettes.   Clean and remove all ashtrays from your home, work, and car.  On a card, write down your reasons for quitting. Carry the card with you and read it when you get the urge to smoke.  Cleanse your body of nicotine. Drink enough water and fluids to keep your urine clear or pale yellow. Do this after quitting to flush the nicotine from your body.  Learn to predict your moods. Do not let a bad situation be your excuse to have a cigarette. Some situations in your life might tempt you into wanting a cigarette.  Never have "just one" cigarette. It leads to wanting another and another. Remind yourself of your decision to quit.  Change habits associated with smoking. If you smoked while driving or when feeling stressed, try other activities to replace smoking. Stand up when drinking your coffee. Brush your teeth after eating. Sit in a different chair when you read the paper. Avoid alcohol while trying to quit, and try to drink fewer caffeinated beverages. Alcohol and caffeine may urge you to smoke.  Avoid foods and drinks that can trigger a desire to smoke, such as sugary or spicy foods and alcohol.  Ask people who smoke not to smoke around you.  Have something planned to do right after eating or having a cup of coffee. For example, plan to take a walk or exercise.  Try a relaxation exercise to calm you down and decrease your stress. Remember, you may be tense and nervous for the first 2 weeks after you quit, but  this will pass.  Find new activities to keep your hands busy. Play with a pen, coin, or rubber band. Doodle or draw things on paper.  Brush your teeth right after eating. This will help cut down on the craving for the taste of tobacco after meals. You can also try mouthwash.   Use oral substitutes in place of cigarettes. Try using lemon drops, carrots, cinnamon sticks, or chewing gum. Keep them handy so they are available when you have the urge to smoke.  When you have the urge to smoke, try deep breathing.  Designate your home as a nonsmoking area.  If you are a heavy smoker, ask your health care provider about a prescription for nicotine chewing gum. It can ease your withdrawal from nicotine.  Reward yourself. Set aside the cigarette money you save and buy yourself something nice.  Look for support from others. Join a support group or smoking cessation program. Ask someone at home or at work to help you with your plan to quit smoking.  Always ask yourself, "Do I need this cigarette or is this just a reflex?" Tell yourself, "Today, I  choose not to smoke," or "I do not want to smoke." You are reminding yourself of your decision to quit.  Do not replace cigarette smoking with electronic cigarettes (commonly called e-cigarettes). The safety of e-cigarettes is unknown, and some may contain harmful chemicals.  If you relapse, do not give up! Plan ahead and think about what you will do the next time you get the urge to smoke. HOW WILL I FEEL WHEN I QUIT SMOKING? You may have symptoms of withdrawal because your body is used to nicotine (the addictive substance in cigarettes). You may crave cigarettes, be irritable, feel very hungry, cough often, get headaches, or have difficulty concentrating. The withdrawal symptoms are only temporary. They are strongest when you first quit but will go away within 10-14 days. When withdrawal symptoms occur, stay in control. Think about your reasons for quitting.  Remind yourself that these are signs that your body is healing and getting used to being without cigarettes. Remember that withdrawal symptoms are easier to treat than the major diseases that smoking can cause.  Even after the withdrawal is over, expect periodic urges to smoke. However, these cravings are generally short lived and will go away whether you smoke or not. Do not smoke! WHAT RESOURCES ARE AVAILABLE TO HELP ME QUIT SMOKING? Your health care provider can direct you to community resources or hospitals for support, which may include:  Group support.  Education.  Hypnosis.  Therapy. Document Released: 08/26/2004 Document Revised: 04/14/2014 Document Reviewed: 05/16/2013 [facility] Patient Information 2015 [place], [place]. This information is not intended to replace advice given to you by your health care provider. Make sure you discuss any questions you have with your health care provider.  Smoking Cessation Quitting smoking is important to your health and has many advantages. However, it is not always easy to quit since nicotine is a very addictive drug. Oftentimes, people try 3 times or more before being able to quit. This document explains the best ways for you to prepare to quit smoking. Quitting takes hard work and a lot of effort, but you can do it. ADVANTAGES OF QUITTING SMOKING  You will live longer, feel better, and live better.  Your body will feel the impact of quitting smoking almost immediately.  Within 20 minutes, blood pressure decreases. Your pulse returns to its normal level.  After 8 hours, carbon monoxide levels in the blood return to normal. Your oxygen level increases.  After 24 hours, the chance of having a heart attack starts to decrease. Your breath, hair, and body stop smelling like smoke.  After 48 hours, damaged nerve endings begin to recover. Your sense of taste and smell improve.  After 72 hours, the body is virtually free of nicotine. Your bronchial  tubes relax and breathing becomes easier.  After 2 to 12 weeks, lungs can hold more air. Exercise becomes easier and circulation improves.  The risk of having a heart attack, stroke, cancer, or lung disease is greatly reduced.  After 1 year, the risk of coronary heart disease is cut in half.  After 5 years, the risk of stroke falls to the same as a nonsmoker.  After 10 years, the risk of lung cancer is cut in half and the risk of other cancers decreases significantly.  After 15 years, the risk of coronary heart disease drops, usually to the level of a nonsmoker.  If you are pregnant, quitting smoking will improve your chances of having a healthy baby.  The people you live with, especially  any children, will be healthier.  You will have extra money to spend on things other than cigarettes. QUESTIONS TO THINK ABOUT BEFORE ATTEMPTING TO QUIT You may want to talk about your answers with your health care provider.  Why do you want to quit?  If you tried to quit in the past, what helped and what did not?  What will be the most difficult situations for you after you quit? How will you plan to handle them?  Who can help you through the tough times? Your family? Friends? A health care provider?  What pleasures do you get from smoking? What ways can you still get pleasure if you quit? Here are some questions to ask your health care provider:  How can you help me to be successful at quitting?  What medicine do you think would be best for me and how should I take it?  What should I do if I need more help?  What is smoking withdrawal like? How can I get information on withdrawal? GET READY  Set a quit date.  Change your environment by getting rid of all cigarettes, ashtrays, matches, and lighters in your home, car, or work. Do not let people smoke in your home.  Review your past attempts to quit. Think about what worked and what did not. GET SUPPORT AND ENCOURAGEMENT You have a  better chance of being successful if you have help. You can get support in many ways.  Tell your family, friends, and coworkers that you are going to quit and need their support. Ask them not to smoke around you.  Get individual, group, or telephone counseling and support. Programs are available at [employer] and health centers. Call your local health department for information about programs in your area.  Spiritual beliefs and practices may help some smokers quit.  Download a "quit meter" on your computer to keep track of quit statistics, such as how long you have gone without smoking, cigarettes not smoked, and money saved.  Get a self-help book about quitting smoking and staying off tobacco. LEARN NEW SKILLS AND BEHAVIORS  Distract yourself from urges to smoke. Talk to someone, go for a walk, or occupy your time with a task.  Change your normal routine. Take a different route to work. Drink tea instead of coffee. Eat breakfast in a different place.  Reduce your stress. Take a hot bath, exercise, or read a book.  Plan something enjoyable to do every day. Reward yourself for not smoking.  Explore interactive web-based programs that specialize in helping you quit. GET MEDICINE AND USE IT CORRECTLY Medicines can help you stop smoking and decrease the urge to smoke. Combining medicine with the above behavioral methods and support can greatly increase your chances of successfully quitting smoking.  Nicotine replacement therapy helps deliver nicotine to your body without the negative effects and risks of smoking. Nicotine replacement therapy includes nicotine gum, lozenges, inhalers, nasal sprays, and skin patches. Some may be available over-the-counter and others require a prescription.  Antidepressant medicine helps people abstain from smoking, but how this works is unknown. This medicine is available by prescription.  Nicotinic receptor partial agonist medicine simulates the effect  of nicotine in your brain. This medicine is available by prescription. Ask your health care provider for advice about which medicines to use and how to use them based on your health history. Your health care provider will tell you what side effects to look out for if you choose to be on  a medicine or therapy. Carefully read the information on the package. Do not use any other product containing nicotine while using a nicotine replacement product.  RELAPSE OR DIFFICULT SITUATIONS Most relapses occur within the first 3 months after quitting. Do not be discouraged if you start smoking again. Remember, most people try several times before finally quitting. You may have symptoms of withdrawal because your body is used to nicotine. You may crave cigarettes, be irritable, feel very hungry, cough often, get headaches, or have difficulty concentrating. The withdrawal symptoms are only temporary. They are strongest when you first quit, but they will go away within 10-14 days. To reduce the chances of relapse, try to:  Avoid drinking alcohol. Drinking lowers your chances of successfully quitting.  Reduce the amount of caffeine you consume. Once you quit smoking, the amount of caffeine in your body increases and can give you symptoms, such as a rapid heartbeat, sweating, and anxiety.  Avoid smokers because they can make you want to smoke.  Do not let weight gain distract you. Many smokers will gain weight when they quit, usually less than 10 pounds. Eat a healthy diet and stay active. You can always lose the weight gained after you quit.  Find ways to improve your mood other than smoking. FOR MORE INFORMATION  www.smokefree.gov  Document Released: 11/22/2001 Document Revised: 04/14/2014 Document Reviewed: 03/08/2012 [facility] Patient Information 2015 [place]. This information is not intended to replace advice given to you by your health care provider. Make sure you discuss any questions you have with  your health care provider.

## 2014-11-11 NOTE — ED Notes (Signed)
Pt reports cough that stated last night & nasal congestion. Pt says has had fluid on her ears also.

## 2015-10-31 IMAGING — CR DG LUMBAR SPINE COMPLETE 4+V
5 series · 5 of 5 positions shown · non-contrast
Comparison: MRI lumbar spine 01/26/2013.

CLINICAL DATA: Injury.  Mid and low back pain.

EXAM:
LUMBAR SPINE - COMPLETE 4+ VIEW

[t l-spine a.p.]
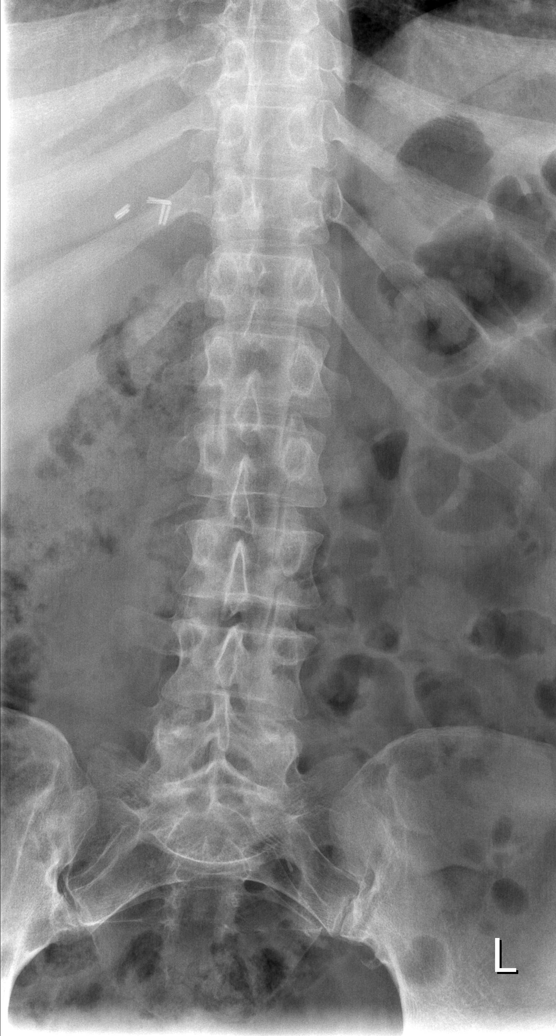

[t l-spine oblique exposure (1 of 2)]
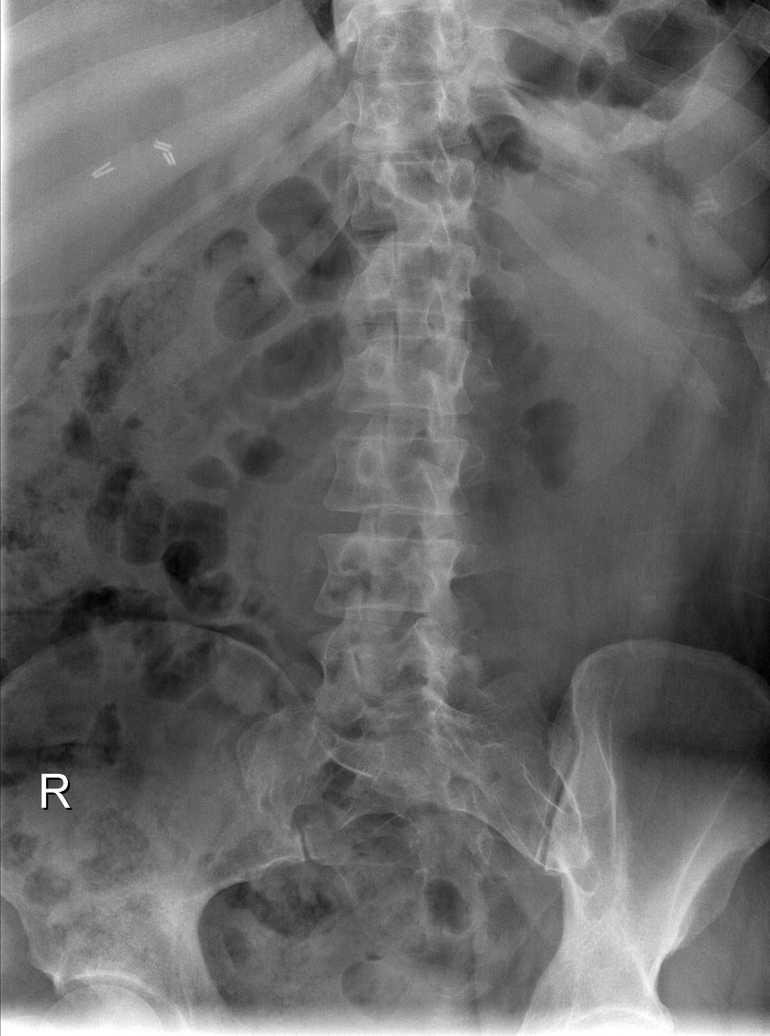

[t l-spine oblique exposure (2 of 2)]
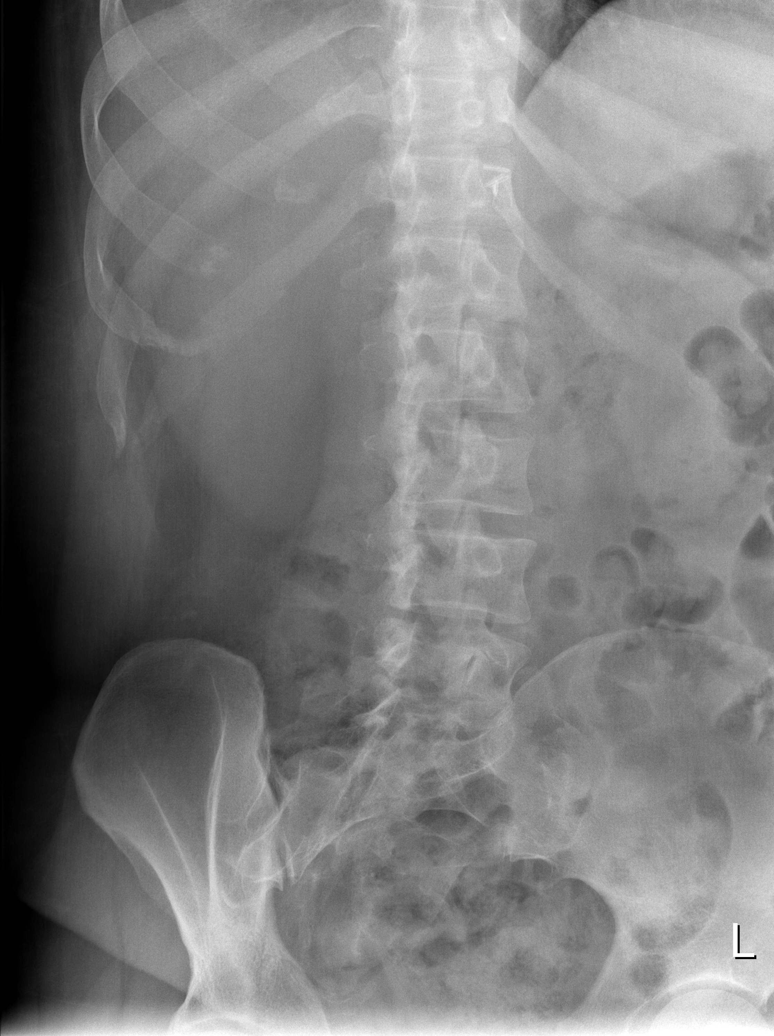

[t l-spine lat]
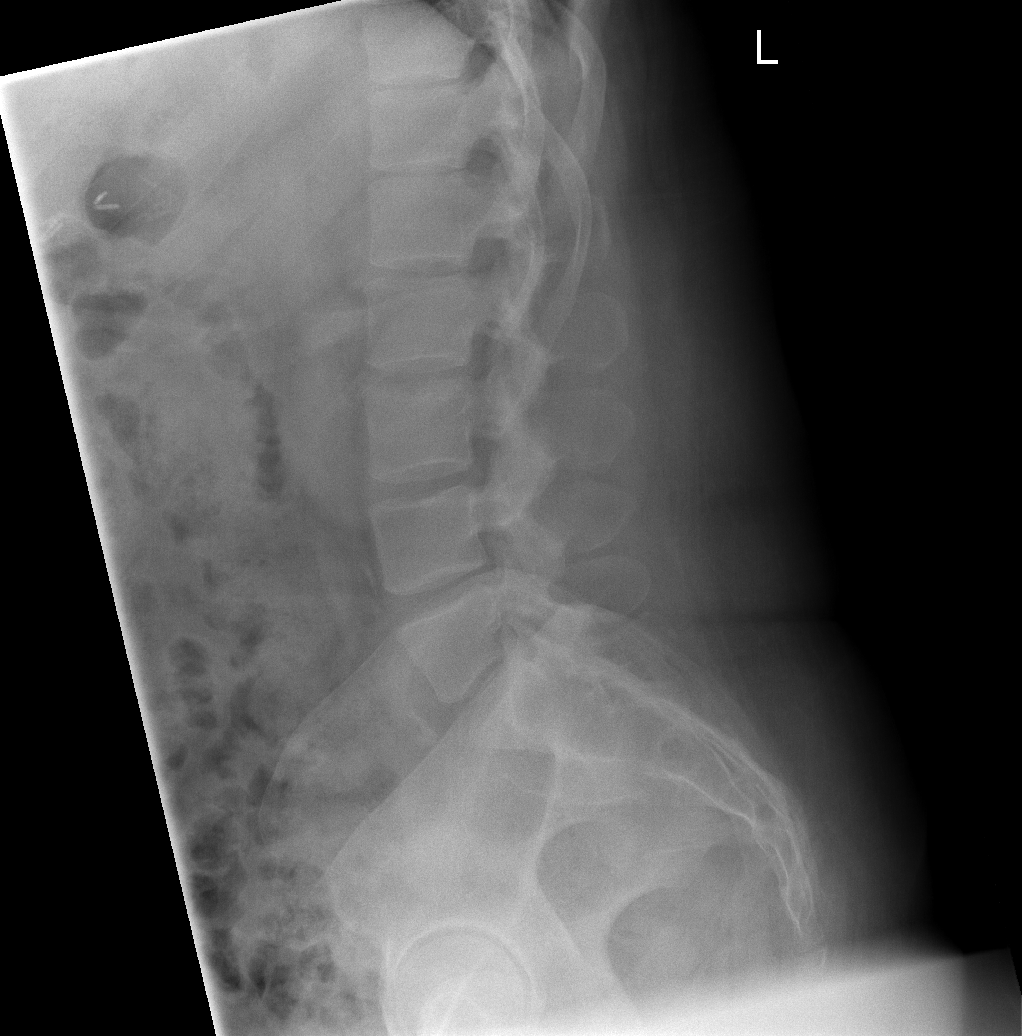

[t l-spine l5-s1 spot]
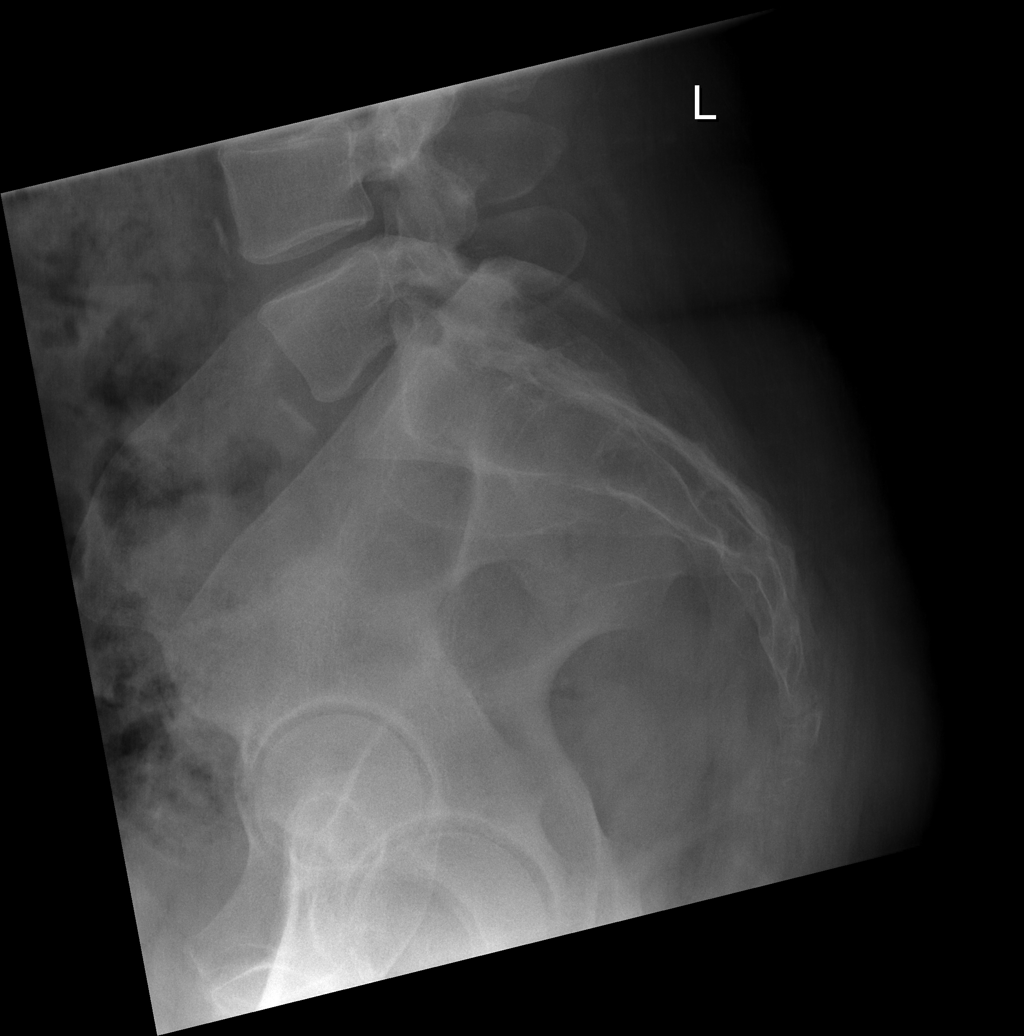

[5 of 5 positions shown; findings below may reference images not displayed]

FINDINGS: Vertebral body height and alignment are normal. Intervertebral disc
space height is maintained. Cholecystectomy clips are noted.
IMPRESSION: Negative exam.

## 2015-12-25 ENCOUNTER — Ambulatory Visit (INDEPENDENT_AMBULATORY_CARE_PROVIDER_SITE_OTHER): Payer: Federal, State, Local not specified - PPO | Admitting: Family Medicine

## 2015-12-25 ENCOUNTER — Encounter: Payer: Self-pay | Admitting: Family Medicine

## 2015-12-25 VITALS — BP 118/72 | HR 90 | Temp 99.3°F | Ht 68.0 in | Wt 228.0 lb

## 2015-12-25 DIAGNOSIS — R509 Fever, unspecified: Secondary | ICD-10-CM | POA: Diagnosis not present

## 2015-12-25 DIAGNOSIS — J019 Acute sinusitis, unspecified: Secondary | ICD-10-CM

## 2015-12-25 DIAGNOSIS — R52 Pain, unspecified: Secondary | ICD-10-CM

## 2015-12-25 LAB — POCT INFLUENZA A/B
INFLUENZA A, POC: NEGATIVE
INFLUENZA B, POC: NEGATIVE

## 2015-12-25 MED ORDER — FLUTICASONE PROPIONATE 50 MCG/ACT NA SUSP: 1.0000 | Freq: Two times a day (BID) | NASAL | Status: DC | PRN

## 2015-12-25 MED ORDER — AZITHROMYCIN 250 MG PO TABS: ORAL_TABLET | ORAL | Status: DC

## 2015-12-25 NOTE — Progress Notes (Signed)
BP 118/72 mmHg  Pulse 90  Temp(Src) 99.3 F (37.4 C) (Oral)  Ht 5\' 8"  (1.727 m)  Wt 228 lb (103.42 kg)  BMI 34.68 kg/m2  SpO2 98%   Subjective:    Patient ID: Margaret Wright, female    DOB: 04/14/1976, 40 y.o.   MRN: [id number]  HPI: Margaret Wright is a 40 y.o. female presenting on 12/25/2015 for Sinusitis and Generalized Body Aches   HPI Sinus congestion and dizziness and body aches Patient has been having sinus congestion and dizziness and generalized body aches and chills and fatigue for the past day. She has not tried anything over-the-counter because she says she doesn't have enough money. She denies any shortness of breath or wheezing. She does not know of any sick contacts.  Relevant past medical, surgical, family and social history reviewed and updated as indicated. Interim medical history since our last visit reviewed. Allergies and medications reviewed and updated.  Review of Systems  Constitutional: Positive for chills and fatigue. Negative for fever.  HENT: Positive for congestion, postnasal drip, rhinorrhea, sinus pressure and sore throat. Negative for ear discharge, ear pain and sneezing.   Eyes: Negative for pain, redness and visual disturbance.  Respiratory: Positive for cough. Negative for chest tightness and shortness of breath.   Cardiovascular: Negative for chest pain and leg swelling.  Genitourinary: Negative for dysuria and difficulty urinating.  Musculoskeletal: Positive for myalgias. Negative for back pain and gait problem.  Skin: Negative for rash.  Neurological: Negative for light-headedness and headaches.  Psychiatric/Behavioral: Negative for behavioral problems and agitation.  All other systems reviewed and are negative.   Per HPI unless specifically indicated above     Medication List       This list is accurate as of: 12/25/15 10:43 AM.  Always use your most recent med list.               azithromycin 250 MG tablet    Commonly known as:  ZITHROMAX  Take 2 the first day and then one each day after.     fluticasone 50 MCG/ACT nasal spray  Commonly known as:  FLONASE  Place 1 spray into both nostrils 2 (two) times daily as needed for allergies or rhinitis.     gabapentin 100 MG capsule  Commonly known as:  NEURONTIN  Take 100 mg by mouth 3 (three) times daily.     guaiFENesin 600 MG 12 hr tablet  Commonly known as:  MUCINEX  Take by mouth 2 (two) times daily.           Objective:    BP 118/72 mmHg  Pulse 90  Temp(Src) 99.3 F (37.4 C) (Oral)  Ht 5\' 8"  (1.727 m)  Wt 228 lb (103.42 kg)  BMI 34.68 kg/m2  SpO2 98%  Wt Readings from Last 3 Encounters:  12/25/15 228 lb (103.42 kg)  11/11/14 286 lb (129.729 kg)  06/26/14 297 lb (134.718 kg)    Physical Exam  Constitutional: She is oriented to person, place, and time. She appears well-developed and well-nourished. No distress.  HENT:  Right Ear: Tympanic membrane, external ear and ear canal normal.  Left Ear: Tympanic membrane, external ear and ear canal normal.  Nose: Mucosal edema and rhinorrhea present. No epistaxis. Right sinus exhibits no maxillary sinus tenderness and no frontal sinus tenderness. Left sinus exhibits no maxillary sinus tenderness and no frontal sinus tenderness.  Mouth/Throat: Uvula is midline and mucous membranes are normal. Posterior oropharyngeal edema and posterior oropharyngeal  erythema present. No oropharyngeal exudate or tonsillar abscesses.  Eyes: Conjunctivae and EOM are normal.  Neck: Neck supple. No thyromegaly present.  Cardiovascular: Normal rate, regular rhythm, normal heart sounds and intact distal pulses.   No murmur heard. Pulmonary/Chest: Effort normal and breath sounds normal. No respiratory distress. She has no wheezes. She has no rales.  Musculoskeletal: Normal range of motion. She exhibits no edema or tenderness.  Lymphadenopathy:    She has no cervical adenopathy.  Neurological: She is alert and  oriented to person, place, and time. Coordination normal.  Skin: Skin is warm and dry. No rash noted. She is not diaphoretic.  Psychiatric: She has a normal mood and affect. Her behavior is normal.  Nursing note and vitals reviewed.   Results for orders placed or performed in visit on 12/25/15  POCT Influenza A/B  Result Value Ref Range   Influenza A, POC Negative Negative   Influenza B, POC Negative Negative      Assessment & Plan:   Problem List Items Addressed This Visit    None    Visit Diagnoses    Body aches    -  Primary    Relevant Medications    azithromycin (ZITHROMAX) 250 MG tablet    fluticasone (FLONASE) 50 MCG/ACT nasal spray    Other Relevant Orders    POCT Influenza A/B (Completed)    Fever, unspecified        Relevant Medications    azithromycin (ZITHROMAX) 250 MG tablet    fluticasone (FLONASE) 50 MCG/ACT nasal spray    Other Relevant Orders    POCT Influenza A/B (Completed)    Acute rhinosinusitis        Relevant Medications    guaiFENesin (MUCINEX) 600 MG 12 hr tablet    azithromycin (ZITHROMAX) 250 MG tablet    fluticasone (FLONASE) 50 MCG/ACT nasal spray        Follow up plan: Return in about 2 weeks (around 01/08/2016), or if symptoms worsen or fail to improve, for Diabetes check and labs.  Counseling provided for all of the vaccine components Orders Placed This Encounter  Procedures  . POCT Influenza A/B    Arville Care, MD [facility] 12/25/2015, 10:43 AM

## 2016-01-08 ENCOUNTER — Ambulatory Visit: Payer: Federal, State, Local not specified - PPO | Admitting: Family Medicine

## 2016-09-21 DIAGNOSIS — Z23 Encounter for immunization: Secondary | ICD-10-CM | POA: Diagnosis not present

## 2016-10-14 ENCOUNTER — Encounter: Payer: Self-pay | Admitting: Family

## 2016-10-14 ENCOUNTER — Ambulatory Visit: Payer: Federal, State, Local not specified - PPO | Admitting: Family

## 2016-10-14 VITALS — BP 126/82 | HR 89 | Temp 98.1°F | Ht 68.0 in | Wt 257.4 lb

## 2016-10-14 DIAGNOSIS — E039 Hypothyroidism, unspecified: Secondary | ICD-10-CM | POA: Diagnosis not present

## 2016-10-14 DIAGNOSIS — F411 Generalized anxiety disorder: Secondary | ICD-10-CM | POA: Insufficient documentation

## 2016-10-14 DIAGNOSIS — E1165 Type 2 diabetes mellitus with hyperglycemia: Secondary | ICD-10-CM

## 2016-10-14 DIAGNOSIS — F32A Depression, unspecified: Secondary | ICD-10-CM | POA: Insufficient documentation

## 2016-10-14 DIAGNOSIS — Z1231 Encounter for screening mammogram for malignant neoplasm of breast: Secondary | ICD-10-CM

## 2016-10-14 DIAGNOSIS — F332 Major depressive disorder, recurrent severe without psychotic features: Secondary | ICD-10-CM

## 2016-10-14 DIAGNOSIS — E119 Type 2 diabetes mellitus without complications: Secondary | ICD-10-CM | POA: Insufficient documentation

## 2016-10-14 DIAGNOSIS — F17200 Nicotine dependence, unspecified, uncomplicated: Secondary | ICD-10-CM | POA: Diagnosis not present

## 2016-10-14 DIAGNOSIS — Z1239 Encounter for other screening for malignant neoplasm of breast: Secondary | ICD-10-CM

## 2016-10-14 DIAGNOSIS — F329 Major depressive disorder, single episode, unspecified: Secondary | ICD-10-CM | POA: Insufficient documentation

## 2016-10-14 LAB — BAYER DCA HB A1C WAIVED: HB A1C: 4.8 % (ref <7.0)

## 2016-10-14 MED ORDER — VORTIOXETINE HBR 10 MG PO TABS: 10.0000 mg | ORAL_TABLET | Freq: Every day | ORAL | 1 refills | Status: DC

## 2016-10-14 NOTE — Patient Instructions (Signed)
Major Depressive Disorder Major depressive disorder is a mental illness. It also may be called clinical depression or unipolar depression. Major depressive disorder usually causes feelings of sadness, hopelessness, or helplessness. Some people with this disorder do not feel particularly sad but lose interest in doing things they used to enjoy (anhedonia). Major depressive disorder also can cause physical symptoms. It can interfere with work, school, relationships, and other normal everyday activities. The disorder varies in severity but is longer lasting and more serious than the sadness we all feel from time to time in our lives. Major depressive disorder often is triggered by stressful life events or major life changes. Examples of these triggers include divorce, loss of your job or home, a move, and the death of a family member or close friend. Sometimes this disorder occurs for no obvious reason at all. People who have family members with major depressive disorder or bipolar disorder are at higher risk for developing this disorder, with or without life stressors. Major depressive disorder can occur at any age. It may occur just once in your life (single episode major depressive disorder). It may occur multiple times (recurrent major depressive disorder). SYMPTOMS People with major depressive disorder have either anhedonia or depressed mood on nearly a daily basis for at least 2 weeks or longer. Symptoms of depressed mood include:  Feelings of sadness (blue or down in the dumps) or emptiness.  Feelings of hopelessness or helplessness.  Tearfulness or episodes of crying (may be observed by others).  Irritability (children and adolescents). In addition to depressed mood or anhedonia or both, people with this disorder have at least four of the following symptoms:  Difficulty sleeping or sleeping too much.   Significant change (increase or decrease) in appetite or weight.   Lack of energy or  motivation.  Feelings of guilt and worthlessness.   Difficulty concentrating, remembering, or making decisions.  Unusually slow movement (psychomotor retardation) or restlessness (as observed by others).   Recurrent wishes for death, recurrent thoughts of self-harm (suicide), or a suicide attempt. People with major depressive disorder commonly have persistent negative thoughts about themselves, other people, and the world. People with severe major depressive disorder may experiencedistorted beliefs or perceptions about the world (psychotic delusions). They also may see or hear things that are not real (psychotic hallucinations). DIAGNOSIS Major depressive disorder is diagnosed through an assessment by your health care provider. Your health care provider will ask aboutaspects of your daily life, such as mood,sleep, and appetite, to see if you have the diagnostic symptoms of major depressive disorder. Your health care provider may ask about your medical history and use of alcohol or drugs, including prescription medicines. Your health care provider also may do a physical exam and blood work. This is because certain medical conditions and the use of certain substances can cause major depressive disorder-like symptoms (secondary depression). Your health care provider also may refer you to a mental health specialist for further evaluation and treatment. TREATMENT It is important to recognize the symptoms of major depressive disorder and seek treatment. The following treatments can be prescribed for this disorder:   Medicine. Antidepressant medicines usually are prescribed. Antidepressant medicines are thought to correct chemical imbalances in the brain that are commonly associated with major depressive disorder. Other types of medicine may be added if the symptoms do not respond to antidepressant medicines alone or if psychotic delusions or hallucinations occur.  Talk therapy. Talk therapy can be  helpful in treating major depressive disorder by providing   support, education, and guidance. Certain types of talk therapy also can help with negative thinking (cognitive behavioral therapy) and with relationship issues that trigger this disorder (interpersonal therapy). A mental health specialist can help determine which treatment is best for you. Most people with major depressive disorder do well with a combination of medicine and talk therapy. Treatments involving electrical stimulation of the brain can be used in situations with extremely severe symptoms or when medicine and talk therapy do not work over time. These treatments include electroconvulsive therapy, transcranial magnetic stimulation, and vagal nerve stimulation.   This information is not intended to replace advice given to you by your health care provider. Make sure you discuss any questions you have with your health care provider.   Document Released: 03/25/2013 Document Revised: 12/19/2014 Document Reviewed: 03/25/2013 Elsevier Interactive Patient Education 2016 [employer]  

## 2016-10-14 NOTE — Progress Notes (Addendum)
Subjective:    Patient ID: Margaret Wright, female    DOB: 1976-11-04, 40 y.o.   MRN: [id number]  Pt presents to the office today for recurrent depression. PT is very tearful and states she is currently not taking any medications at this time because she can not afford it. PT states she "hates people" and does not want to around anyone. Pz t has taken zoloft, wellbutrin, and xanax in the past.  Depression       The patient presents with depression.  This is a recurrent problem.  The current episode started more than 1 year ago.   The onset quality is gradual.   The problem occurs constantly.  The problem has been rapidly worsening since onset.  Associated symptoms include decreased concentration, fatigue, helplessness, hopelessness, irritable, decreased interest and sad.  Associated symptoms include no suicidal ideas.  Past treatments include nothing.  Compliance with treatment is poor.  Previous treatment provided no relief relief.  Past medical history includes thyroid problem, anxiety and depression.   Anxiety  Presents for follow-up visit. Symptoms include decreased concentration, depressed mood, excessive worry, irritability, nervous/anxious behavior and panic. Patient reports no suicidal ideas. Symptoms occur constantly.   Her past medical history is significant for depression.  Diabetes  She presents for her follow-up diabetic visit. She has type 2 diabetes mellitus. Hypoglycemia symptoms include nervousness/anxiousness. Associated symptoms include fatigue. Pertinent negatives for diabetes include no blurred vision, no foot paresthesias and no foot ulcerations. There are no hypoglycemic complications. Symptoms are stable. Pertinent negatives for diabetic complications include no CVA, heart disease, nephropathy or peripheral neuropathy. Risk factors for coronary artery disease include diabetes mellitus, obesity, family history, stress and tobacco exposure. Current diabetic treatment includes  diet. She is compliant with treatment some of the time. She is following a generally unhealthy diet. She never participates in exercise. (Does not check blood sugars at home ) Eye exam is not current.  Thyroid Problem  Presents for follow-up visit. Symptoms include anxiety, constipation, depressed mood and fatigue. The symptoms have been stable.      Review of Systems  Constitutional: Positive for fatigue and irritability.  Eyes: Negative for blurred vision.  Gastrointestinal: Positive for constipation.  Psychiatric/Behavioral: Positive for decreased concentration and depression. Negative for self-injury and suicidal ideas. The patient is nervous/anxious.   All other systems reviewed and are negative.      Objective:   Physical Exam  Constitutional: She is oriented to person, place, and time. She appears well-developed and well-nourished. She is irritable. No distress.  Cardiovascular: Normal rate, regular rhythm, normal heart sounds and intact distal pulses.   No murmur heard. Pulmonary/Chest: Effort normal and breath sounds normal. No respiratory distress. She has no wheezes.  Abdominal: Soft. Bowel sounds are normal. She exhibits no distension. There is no tenderness.  Musculoskeletal: Normal range of motion. She exhibits no edema or tenderness.  Neurological: She is alert and oriented to person, place, and time.  Skin: Skin is warm and dry.  Psychiatric: Her speech is normal. Judgment and thought content normal. Her mood appears anxious. She is withdrawn. Cognition and memory are normal. She exhibits a depressed mood.  Tearful  Vitals reviewed.   BP 126/82   Pulse 89   Temp 98.1 F (36.7 C) (Oral)   Ht '5\' 8"'  (1.727 m)   Wt 257 lb 6.4 oz (116.8 kg)   BMI 39.14 kg/m        Assessment & Plan:  1. GAD (generalized anxiety  disorder) -PT started on Trintellix today. Samples given today, because states she can not afford medications at this time -Stress management  discussed -List of psychologists given today - CMP14+EGFR - vortioxetine HBr (TRINTELLIX) 10 MG TABS; Take 1 tablet (10 mg total) by mouth daily.  Dispense: 90 tablet; Refill: 1  2. Severe episode of recurrent major depressive disorder, without psychotic features ([place]) -PT started on Trintellix today. Samples given today, because states she can not afford medications at this time -Stress management discussed -List of psychologists given today - CMP14+EGFR - vortioxetine HBr (TRINTELLIX) 10 MG TABS; Take 1 tablet (10 mg total) by mouth daily.  Dispense: 90 tablet; Refill: 1  3. Type 2 diabetes mellitus with hyperglycemia, without long-term current use of insulin (HCC) -Labs pending - CMP14+EGFR - Bayer DCA Hb A1c Waived - Lipid panel - Microalbumin / creatinine urine ratio - Ambulatory referral to Ophthalmology  4. Morbid obesity ([place]) - CMP14+EGFR - Lipid panel  5. Current smoker - CMP14+EGFR  6. Breast cancer screening - CMP14+EGFR - MM Digital Screening; Future  7. Hypothyroidism, unspecified type -Pt states she has history of hypothyroidism and was on medication in the past - Thyroid Panel With TSH  Continue all meds Labs pending Health Maintenance reviewed Diet and exercise encouraged RTO 1 months  Evelina Dun, FNP

## 2016-10-14 NOTE — Addendum Note (Signed)
Addended by: Jannifer RodneyHAWKS, Chenay Nesmith A on: 10/14/2016 01:08 PM   Modules accepted: Orders

## 2016-10-15 LAB — CMP14+EGFR
ALT: 14 IU/L (ref 0–32)
AST: 16 IU/L (ref 0–40)
Albumin/Globulin Ratio: 1.3 (ref 1.2–2.2)
Albumin: 4.3 g/dL (ref 3.5–5.5)
Alkaline Phosphatase: 74 IU/L (ref 39–117)
BILIRUBIN TOTAL: 0.2 mg/dL (ref 0.0–1.2)
BUN/Creatinine Ratio: 10 (ref 9–23)
BUN: 7 mg/dL (ref 6–24)
CO2: 26 mmol/L (ref 18–29)
Calcium: 10.2 mg/dL (ref 8.7–10.2)
Chloride: 102 mmol/L (ref 96–106)
Creatinine, Ser: 0.69 mg/dL (ref 0.57–1.00)
GFR calc Af Amer: 125 mL/min/{1.73_m2} (ref >59)
GFR, EST NON AFRICAN AMERICAN: 108 mL/min/{1.73_m2} (ref >59)
GLUCOSE: 104 mg/dL — AB (ref 65–99)
Globulin, Total: 3.3 g/dL (ref 1.5–4.5)
Potassium: 4.4 mmol/L (ref 3.5–5.2)
Sodium: 144 mmol/L (ref 134–144)
Total Protein: 7.6 g/dL (ref 6.0–8.5)

## 2016-10-15 LAB — LIPID PANEL
Chol/HDL Ratio: 3.5 ratio units (ref 0.0–4.4)
Cholesterol, Total: 204 mg/dL — ABNORMAL HIGH (ref 100–199)
HDL: 59 mg/dL (ref >39)
LDL Calculated: 123 mg/dL — ABNORMAL HIGH (ref 0–99)
Triglycerides: 110 mg/dL (ref 0–149)
VLDL CHOLESTEROL CAL: 22 mg/dL (ref 5–40)

## 2016-10-15 LAB — THYROID PANEL WITH TSH
Free Thyroxine Index: 2.7 (ref 1.2–4.9)
T3 Uptake Ratio: 27 % (ref 24–39)
T4, Total: 10.1 ug/dL (ref 4.5–12.0)
TSH: 0.818 u[IU]/mL (ref 0.450–4.500)

## 2017-01-17 ENCOUNTER — Encounter: Payer: Self-pay | Admitting: Family Medicine

## 2017-01-17 ENCOUNTER — Ambulatory Visit (INDEPENDENT_AMBULATORY_CARE_PROVIDER_SITE_OTHER): Payer: Federal, State, Local not specified - PPO | Admitting: Family Medicine

## 2017-01-17 VITALS — BP 118/76 | HR 99 | Temp 99.5°F | Ht 68.0 in | Wt 251.0 lb

## 2017-01-17 DIAGNOSIS — J111 Influenza due to unidentified influenza virus with other respiratory manifestations: Secondary | ICD-10-CM | POA: Diagnosis not present

## 2017-01-17 MED ORDER — OSELTAMIVIR PHOSPHATE 75 MG PO CAPS: 75.0000 mg | ORAL_CAPSULE | Freq: Two times a day (BID) | ORAL | 0 refills | Status: DC

## 2017-01-17 MED ORDER — HYDROCODONE-HOMATROPINE 5-1.5 MG/5ML PO SYRP: 5.0000 mL | ORAL_SOLUTION | Freq: Three times a day (TID) | ORAL | 0 refills | Status: DC | PRN

## 2017-01-17 NOTE — Progress Notes (Signed)
   Subjective:    Patient ID: Margaret Wright, female    DOB: 05/17/1976, 41 y.o.   MRN: [id number]  HPI Patient here today for flu like symptoms that started yesterday.    Patient Active Problem List   Diagnosis Date Noted  . GAD (generalized anxiety disorder) 10/14/2016  . Depression 10/14/2016  . Diabetes mellitus (HCC) 10/14/2016  . Morbid obesity (HCC) 10/14/2016  . Current smoker 10/14/2016   Outpatient Encounter Prescriptions as of 01/17/2017  Medication Sig  . [DISCONTINUED] fluticasone (FLONASE) 50 MCG/ACT nasal spray Place 1 spray into both nostrils 2 (two) times daily as needed for allergies or rhinitis.  . [DISCONTINUED] gabapentin (NEURONTIN) 100 MG capsule Take 100 mg by mouth 3 (three) times daily.  . [DISCONTINUED] vortioxetine HBr (TRINTELLIX) 10 MG TABS Take 1 tablet (10 mg total) by mouth daily.   No facility-administered encounter medications on file as of 01/17/2017.       Review of Systems  Constitutional: Positive for chills and fever.  HENT: Positive for congestion and sore throat.   Eyes: Negative.   Respiratory: Positive for chest tightness.   Cardiovascular: Negative.   Gastrointestinal: Positive for diarrhea (3 days ago).  Endocrine: Negative.   Genitourinary: Negative.   Musculoskeletal: Positive for myalgias.  Skin: Negative.   Allergic/Immunologic: Negative.   Neurological: Negative.   Hematological: Negative.   Psychiatric/Behavioral: Negative.        Objective:   Physical Exam  Constitutional: She appears well-developed and well-nourished.  HENT:  Mouth/Throat: Oropharynx is clear and moist.  Cardiovascular: Normal rate, regular rhythm and normal heart sounds.   Pulmonary/Chest: Effort normal and breath sounds normal.  Neurological: She is alert.  Psychiatric: She has a normal mood and affect. Her behavior is normal.   BP 118/76 (BP Location: Left Arm)   Pulse 99   Temp 99.5 F (37.5 C) (Oral)   Ht 5\' 8"  (1.727 m)   Wt 251 lb  (113.9 kg)   BMI 38.16 kg/m         Assessment & Plan:  1. Influenza There is fluid in the home and patient has most of the symptoms. She is coughing incessantly during the exam. We discussed doing the test but patient does not have resources to pay for the test and agrees Tamiflu which is also extensive we decided to treat empirically. Also provide some Hycodan for cough. General measures rest fluids Tylenol or ibuprofen for fever and myalgias  Frederica KusterStephen M Miller MD

## 2017-03-08 ENCOUNTER — Encounter: Payer: Federal, State, Local not specified - PPO | Admitting: *Deleted

## 2018-07-16 ENCOUNTER — Ambulatory Visit: Payer: Federal, State, Local not specified - PPO | Admitting: Nurse Practitioner

## 2018-07-16 ENCOUNTER — Encounter: Payer: Self-pay | Admitting: Nurse Practitioner

## 2018-07-16 VITALS — BP 135/88 | HR 78 | Temp 98.3°F | Ht 68.0 in | Wt 280.0 lb

## 2018-07-16 DIAGNOSIS — H1031 Unspecified acute conjunctivitis, right eye: Secondary | ICD-10-CM | POA: Diagnosis not present

## 2018-07-16 DIAGNOSIS — M5441 Lumbago with sciatica, right side: Secondary | ICD-10-CM | POA: Diagnosis not present

## 2018-07-16 MED ORDER — CYCLOBENZAPRINE HCL 10 MG PO TABS: 10.0000 mg | ORAL_TABLET | Freq: Three times a day (TID) | ORAL | 1 refills | Status: DC | PRN

## 2018-07-16 MED ORDER — METHYLPREDNISOLONE ACETATE 80 MG/ML IJ SUSP
80.0000 mg | Freq: Once | INTRAMUSCULAR | Status: AC
  Administered 2018-07-16: 80 mg via INTRAMUSCULAR

## 2018-07-16 MED ORDER — PREDNISONE 20 MG PO TABS: ORAL_TABLET | ORAL | 0 refills | Status: DC

## 2018-07-16 MED ORDER — POLYMYXIN B-TRIMETHOPRIM 10000-0.1 UNIT/ML-% OP SOLN
2.0000 [drp] | OPHTHALMIC | 0 refills | Status: DC
Start: 2018-07-16 — End: 2022-10-05

## 2018-07-16 NOTE — Progress Notes (Signed)
   Subjective:    Patient ID: Margaret Wright, female    DOB: 07/30/1976, 42 y.o.   MRN: [id number]   Chief Complaint: Conjunctivitis and Back Pain   HPI Patient comes in today with 2 complaints: - right eye red and draining. Was matted together this morning.started about 5 days ago. - also c/o back pain. Has always had back problems. Caught a catch in hr back over the weekend. Rates pain 6.5/10 currently. Pain radiates down right leg sometimes. Any movement increases pain. Being still makes it better.   Review of Systems  Constitutional: Negative.   HENT: Negative.   Eyes: Positive for pain, discharge, redness and itching.  Respiratory: Negative.   Cardiovascular: Negative.   Gastrointestinal: Negative.   Musculoskeletal: Positive for back pain.  Neurological: Negative.   Psychiatric/Behavioral: Negative.   All other systems reviewed and are negative.      Objective:   Physical Exam  Constitutional: She is oriented to person, place, and time. She appears well-developed and well-nourished. No distress.  Eyes: Pupils are equal, round, and reactive to light. EOM are normal. Right eye exhibits discharge.  Right scleral injection.  Neck: Normal range of motion. Neck supple.  Cardiovascular: Normal rate.  Pulmonary/Chest: Effort normal.  Musculoskeletal: Normal range of motion.  Decrease ROM of lumbar soine with pain on flexion Severe pain when getting on exam table pounttenderness right lower back (+) SLR at 45 degress on right.  Neurological: She is alert and oriented to person, place, and time. No cranial nerve deficit. Coordination normal.  Skin: Skin is warm and dry.  Psychiatric: She has a normal mood and affect. Her behavior is normal. Thought content normal.  Nursing note and vitals reviewed.  BP 135/88   Pulse 78   Temp 98.3 F (36.8 C) (Oral)   Ht 5\' 8"  (1.727 m)   Wt 280 lb (127 kg)   BMI 42.57 kg/m         Assessment & Plan:  Margaret Wright  in today with chief complaint of Conjunctivitis and Back Pain   1. Acute bacterial conjunctivitis of right eye Good handwashing Avoid rubbing eye Cool compresses - trimethoprim-polymyxin b (POLYTRIM) ophthalmic solution; Place 2 drops into both eyes every 4 (four) hours.  Dispense: 10 mL; Refill: 0  2. Acute right-sided low back pain with right-sided sciatica Moist heat Rest RTO prn - methylPREDNISolone acetate (DEPO-MEDROL) injection 80 mg - cyclobenzaprine (FLEXERIL) 10 MG tablet; Take 1 tablet (10 mg total) by mouth 3 (three) times daily as needed for muscle spasms.  Dispense: 30 tablet; Refill: 1 - predniSONE (DELTASONE) 20 MG tablet; 2 po at sametime daily for 5 days- start tomorrow  Dispense: 10 tablet; Refill: 0  Mary-Margaret Daphine DeutscherMartin, FNP

## 2018-07-16 NOTE — Patient Instructions (Signed)

## 2018-11-05 ENCOUNTER — Ambulatory Visit: Payer: Federal, State, Local not specified - PPO

## 2019-03-19 ENCOUNTER — Ambulatory Visit (INDEPENDENT_AMBULATORY_CARE_PROVIDER_SITE_OTHER): Payer: Federal, State, Local not specified - PPO | Admitting: *Deleted

## 2019-03-19 ENCOUNTER — Other Ambulatory Visit: Payer: Self-pay

## 2019-03-19 ENCOUNTER — Telehealth: Payer: Self-pay | Admitting: Family

## 2019-03-19 DIAGNOSIS — Z23 Encounter for immunization: Secondary | ICD-10-CM | POA: Diagnosis not present

## 2019-03-19 NOTE — Progress Notes (Signed)
Pt cut her leg on piece of metal Td vaccine given Pt tolerated well

## 2019-03-19 NOTE — Telephone Encounter (Signed)
Pt cut her leg on piece of metal Requesting Tetanus Advised pt she can come in for Tetanus vaccine

## 2019-05-23 ENCOUNTER — Encounter (INDEPENDENT_AMBULATORY_CARE_PROVIDER_SITE_OTHER): Payer: Self-pay

## 2019-12-10 DIAGNOSIS — R5381 Other malaise: Secondary | ICD-10-CM | POA: Diagnosis not present

## 2019-12-10 DIAGNOSIS — R69 Illness, unspecified: Secondary | ICD-10-CM | POA: Diagnosis not present

## 2019-12-11 DIAGNOSIS — M542 Cervicalgia: Secondary | ICD-10-CM | POA: Diagnosis not present

## 2019-12-11 DIAGNOSIS — R0789 Other chest pain: Secondary | ICD-10-CM | POA: Diagnosis not present

## 2021-06-05 DIAGNOSIS — R110 Nausea: Secondary | ICD-10-CM | POA: Diagnosis not present

## 2021-06-05 DIAGNOSIS — K0889 Other specified disorders of teeth and supporting structures: Secondary | ICD-10-CM | POA: Diagnosis not present

## 2021-06-05 DIAGNOSIS — K029 Dental caries, unspecified: Secondary | ICD-10-CM | POA: Diagnosis not present

## 2021-06-05 DIAGNOSIS — F17200 Nicotine dependence, unspecified, uncomplicated: Secondary | ICD-10-CM | POA: Diagnosis not present

## 2021-06-05 DIAGNOSIS — K047 Periapical abscess without sinus: Secondary | ICD-10-CM | POA: Diagnosis not present

## 2022-09-02 ENCOUNTER — Emergency Department (HOSPITAL_BASED_OUTPATIENT_CLINIC_OR_DEPARTMENT_OTHER)
Admission: EM | Admit: 2022-09-02 | Discharge: 2022-09-02 | Disposition: A | Discharge status: Home / Self Care | Payer: Federal, State, Local not specified - PPO | Attending: Emergency Medicine | Admitting: Emergency Medicine

## 2022-09-02 ENCOUNTER — Encounter (HOSPITAL_BASED_OUTPATIENT_CLINIC_OR_DEPARTMENT_OTHER): Payer: Self-pay

## 2022-09-02 ENCOUNTER — Other Ambulatory Visit: Payer: Self-pay

## 2022-09-02 ENCOUNTER — Emergency Department (HOSPITAL_BASED_OUTPATIENT_CLINIC_OR_DEPARTMENT_OTHER): Payer: Federal, State, Local not specified - PPO

## 2022-09-02 DIAGNOSIS — R42 Dizziness and giddiness: Secondary | ICD-10-CM | POA: Diagnosis not present

## 2022-09-02 DIAGNOSIS — I10 Essential (primary) hypertension: Secondary | ICD-10-CM | POA: Diagnosis not present

## 2022-09-02 DIAGNOSIS — E119 Type 2 diabetes mellitus without complications: Secondary | ICD-10-CM | POA: Insufficient documentation

## 2022-09-02 LAB — BASIC METABOLIC PANEL
Anion gap: 9 (ref 5–15)
BUN: 11 mg/dL (ref 6–20)
CO2: 25 mmol/L (ref 22–32)
Calcium: 9.5 mg/dL (ref 8.9–10.3)
Chloride: 101 mmol/L (ref 98–111)
Creatinine, Ser: 0.59 mg/dL (ref 0.44–1.00)
GFR, Estimated: >60 mL/min (ref >60)
Glucose, Bld: 147 mg/dL — ABNORMAL HIGH (ref 70–99)
Potassium: 3.7 mmol/L (ref 3.5–5.1)
Sodium: 135 mmol/L (ref 135–145)

## 2022-09-02 LAB — CBC
HCT: 45.7 % (ref 36.0–46.0)
Hemoglobin: 15.9 g/dL — ABNORMAL HIGH (ref 12.0–15.0)
MCH: 31.7 pg (ref 26.0–34.0)
MCHC: 34.8 g/dL (ref 30.0–36.0)
MCV: 91.2 fL (ref 80.0–100.0)
Platelets: 239 10*3/uL (ref 150–400)
RBC: 5.01 MIL/uL (ref 3.87–5.11)
RDW: 12 % (ref 11.5–15.5)
WBC: 10.7 10*3/uL — ABNORMAL HIGH (ref 4.0–10.5)
nRBC: 0 % (ref 0.0–0.2)

## 2022-09-02 MED ORDER — AMLODIPINE BESYLATE 5 MG PO TABS: 5.0000 mg | ORAL_TABLET | Freq: Every day | ORAL | 0 refills | Status: DC

## 2022-09-02 MED ORDER — ONDANSETRON 8 MG PO TBDP: 8.0000 mg | ORAL_TABLET | Freq: Three times a day (TID) | ORAL | 0 refills | Status: DC | PRN

## 2022-09-02 MED ORDER — MECLIZINE HCL 25 MG PO TABS: 25.0000 mg | ORAL_TABLET | Freq: Three times a day (TID) | ORAL | 0 refills | Status: DC | PRN

## 2022-09-02 NOTE — ED Triage Notes (Signed)
Pt. States has been dizzy about 1 1/2 weeks. States having nausea and vomiting.

## 2022-09-02 NOTE — ED Notes (Signed)
Patient verbalizes understanding of discharge instructions. Opportunity for questioning and answers were provided. Patient discharged from ED.  °

## 2022-09-02 NOTE — ED Provider Notes (Signed)
[facility] DEPT Provider Note   CSN: [id number] Arrival date & time: 09/02/22  1660     History  Chief Complaint  Patient presents with   Hypertension    Margaret Wright is a 46 y.o. female.   Hypertension     Patient has history of diabetes anxiety migraine sciatica who presents to the ED with complaints of malaise dizziness.  Patient states she has been having some dizziness for about the last week and a half.  She feels somewhat off balance at times.  States it kind of feels like vertigo but not exactly.  She is not having any trouble with her vision or speech.  She still is able to walk without difficulty.  She has no focal numbness or weakness.  Patient started taking her blood pressure over the last several days and it has been elevated.  She is getting systolics as high as the 630Z and diastolics as high as the 601U.  Patient has not seen a primary doctor in about 3 years but she still takes her blood pressure intermittently at home and normally does not have elevated blood pressure.  Home Medications Prior to Admission medications   Medication Sig Start Date End Date Taking? Authorizing Provider  cyclobenzaprine (FLEXERIL) 10 MG tablet Take 1 tablet (10 mg total) by mouth 3 (three) times daily as needed for muscle spasms. 07/16/18   Hassell Done, Mary-Margaret, FNP  predniSONE (DELTASONE) 20 MG tablet 2 po at sametime daily for 5 days- start tomorrow 07/16/18   Chevis Pretty, FNP  trimethoprim-polymyxin b (POLYTRIM) ophthalmic solution Place 2 drops into both eyes every 4 (four) hours. 07/16/18   Chevis Pretty, FNP      Allergies    Aspirin and Morphine and related    Review of Systems   Review of Systems  Physical Exam Updated Vital Signs BP (!) 155/90 (BP Location: Right Arm)   Pulse 71   Temp 98.6 F (37 C) (Oral)   Resp 19   Ht 1.727 m (5\' 8" )   Wt 131.5 kg   SpO2 99%   BMI 44.09 kg/m  Physical Exam Vitals and nursing  note reviewed.  Constitutional:      General: She is not in acute distress.    Appearance: She is well-developed.  HENT:     Head: Normocephalic and atraumatic.     Right Ear: External ear normal.     Left Ear: External ear normal.  Eyes:     General: No visual field deficit or scleral icterus.       Right eye: No discharge.        Left eye: No discharge.     Conjunctiva/sclera: Conjunctivae normal.  Neck:     Trachea: No tracheal deviation.  Cardiovascular:     Rate and Rhythm: Normal rate and regular rhythm.  Pulmonary:     Effort: Pulmonary effort is normal. No respiratory distress.     Breath sounds: Normal breath sounds. No stridor. No wheezing or rales.  Abdominal:     General: Bowel sounds are normal. There is no distension.     Palpations: Abdomen is soft.     Tenderness: There is no abdominal tenderness. There is no guarding or rebound.  Musculoskeletal:        General: No tenderness.     Cervical back: Neck supple.  Skin:    General: Skin is warm and dry.     Findings: No rash.  Neurological:     Mental  Status: She is alert and oriented to person, place, and time.     Cranial Nerves: No cranial nerve deficit, dysarthria or facial asymmetry.     Sensory: No sensory deficit.     Motor: No abnormal muscle tone, seizure activity or pronator drift.     Coordination: Coordination normal.     Comments:  able to hold both legs off bed for 5 seconds, sensation intact in all extremities,  no left or right sided neglect, normal finger-nose exam bilaterally, no nystagmus noted   Psychiatric:        Mood and Affect: Mood normal.     ED Results / Procedures / Treatments   Labs (all labs ordered are listed, but only abnormal results are displayed) Labs Reviewed - No data to display  EKG None  Radiology No results found.  Procedures Procedures    Medications Ordered in ED Medications - No data to display  ED Course/ Medical Decision Making/ A&P Clinical Course  as of 09/02/22 1141  Fri Sep 02, 2022  [id number] Basic metabolic panel(!) No significant electrolyte abnormalities [JK]  1127 CBC(!) White blood cell count slightly elevated at 10.7 but I doubt clinically significant [JK]  1127 CT Head Wo Contrast CT without acute abnormalities [JK]    Clinical Course User Index [JK] Dorie Rank, MD                           Medical Decision Making Frontal diagnosis includes but not limited to peripheral vertigo, stroke, anemia, electrolyte abnormality  Problems Addressed: Hypertension, unspecified type: undiagnosed new problem with uncertain prognosis Vertigo: acute illness or injury  Amount and/or Complexity of Data Reviewed Labs: ordered. Decision-making details documented in ED Course. Radiology: ordered and independent interpretation performed. Decision-making details documented in ED Course.  Risk Prescription drug management.   ED work-up reassuring.  Does not have evidence of significant, dehydration or electrolyte abnormalities.  Her neurologic exam is normal without any focal deficits.  CT scan does not show any acute abnormalities that would suggest complications associated with her hypertension.  Patient does not normally have high blood pressure.  Her blood pressure has remained slightly elevated here in the ED.  She does have plans to see her primary care doctor next month.  I will give her prescription for blood pressure medications but have her hold off on taking that.  I recommend she take her blood pressure regularly over the next couple of weeks.  If she continues to have persistently elevated blood pressure she can start taking medications before she see her primary doctor.  Regarding her vertigo symptoms I have low suspicion that this is a central cause.  Suspect this is more peripheral in nature.  We will try her on a course of meclizine and Zofran for symptomatic care.  Evaluation and diagnostic testing in the emergency department  does not suggest an emergent condition requiring admission or immediate intervention beyond what has been performed at this time.  The patient is safe for discharge and has been instructed to return immediately for worsening symptoms, change in symptoms or any other concerns.        Final Clinical Impression(s) / ED Diagnoses Final diagnoses:  None    Rx / DC Orders ED Discharge Orders     None         Dorie Rank, MD 09/02/22 1143

## 2022-09-02 NOTE — Discharge Instructions (Signed)
Continue to monitor your blood pressure.  As we discussed you do not have to start taking blood pressure medications.  Follow-up with your doctor as planned.  However if you continue to have consistently elevated blood pressures over the next couple weeks start taking the blood pressure medication until you are able to see your primary doctor.  Take the meclizine and Zofran to help with the dizziness.  Return to the emergency room for trouble with your vision speech worsening symptoms

## 2022-09-08 ENCOUNTER — Encounter: Payer: Self-pay | Admitting: Family Medicine

## 2022-10-05 ENCOUNTER — Encounter: Payer: Self-pay | Admitting: Family Medicine

## 2022-10-05 ENCOUNTER — Ambulatory Visit: Payer: Federal, State, Local not specified - PPO | Admitting: Family Medicine

## 2022-10-05 DIAGNOSIS — E1165 Type 2 diabetes mellitus with hyperglycemia: Secondary | ICD-10-CM

## 2022-10-05 DIAGNOSIS — R030 Elevated blood-pressure reading, without diagnosis of hypertension: Secondary | ICD-10-CM

## 2022-10-05 DIAGNOSIS — F17200 Nicotine dependence, unspecified, uncomplicated: Secondary | ICD-10-CM | POA: Diagnosis not present

## 2022-10-05 NOTE — Progress Notes (Signed)
Subjective:  Patient ID: Margaret Wright, female    DOB: 04-13-1976  Age: 46 y.o. MRN: [id number]  CC: Establish Care   HPI Margaret Wright presents for new patient visit. I live in pain daily. Pushes herself to do all she can.  Past addiction to opiods. Has back pain with sciatica chronically. Uses CBD & relieves pain and anxiety with weed. USes homeopathic and herbal tx. Worries that people (providers) overprescibe.   Stays dehydrated. Tends to fast during the day. Eat too much at night.Overweight her entire life.   BP high in E.D. Went to 160/117. had vertigo though. Didn't fill the norvasc.      10/05/2022    3:22 PM 07/16/2018    4:39 PM 01/17/2017    9:04 AM  Depression screen PHQ 2/9  Decreased Interest 1 0 0  Down, Depressed, Hopeless 1 0 0  PHQ - 2 Score 2 0 0  Altered sleeping 0    Tired, decreased energy 2    Change in appetite 3    Feeling bad or failure about yourself  1    Trouble concentrating 0    Moving slowly or fidgety/restless 1    Suicidal thoughts 1    PHQ-9 Score 10    Difficult doing work/chores Somewhat difficult      History Margaret Wright has a past medical history of Anxiety, Diabetes mellitus without complication ([place]), Migraines, and Sciatica.   She has a past surgical history that includes Tonsillectomy; Appendectomy; Cholecystectomy; orthopedic surgery; and Novasure ablation.   Her family history includes Diabetes in her mother; Epilepsy in her son; Heart disease in her father; Lung disease in her father.She reports that she has been smoking cigarettes. She has a 15.00 pack-year smoking history. She has never used smokeless tobacco. She reports current alcohol use. She reports current drug use. Frequency: 3.00 times per week. Drug: Marijuana.    ROS Review of Systems  Constitutional: Negative.   HENT:  Negative for congestion.   Eyes:  Negative for visual disturbance.  Respiratory:  Negative for shortness of breath.    Cardiovascular:  Negative for chest pain.  Gastrointestinal:  Negative for abdominal pain, constipation, diarrhea, nausea and vomiting.  Genitourinary:  Negative for difficulty urinating.  Musculoskeletal:  Negative for arthralgias and myalgias.  Neurological:  Negative for headaches.  Psychiatric/Behavioral:  Negative for sleep disturbance.     Objective:  BP 133/86   Pulse 76   Temp 97.8 F (36.6 C)   Ht '5\' 8"'  (1.727 m)   Wt 296 lb (134.3 kg)   SpO2 97%   BMI 45.01 kg/m   BP Readings from Last 3 Encounters:  10/05/22 133/86  09/02/22 (!) 140/98  07/16/18 135/88    Wt Readings from Last 3 Encounters:  10/05/22 296 lb (134.3 kg)  09/02/22 290 lb (131.5 kg)  07/16/18 280 lb (127 kg)     Physical Exam Constitutional:      General: She is not in acute distress.    Appearance: She is well-developed.  HENT:     Head: Normocephalic and atraumatic.  Eyes:     Conjunctiva/sclera: Conjunctivae normal.     Pupils: Pupils are equal, round, and reactive to light.  Neck:     Thyroid: No thyromegaly.  Cardiovascular:     Rate and Rhythm: Normal rate and regular rhythm.     Heart sounds: Normal heart sounds. No murmur heard. Pulmonary:     Effort: Pulmonary effort is normal. No respiratory distress.  Breath sounds: Normal breath sounds. No wheezing or rales.  Abdominal:     General: Bowel sounds are normal. There is no distension.     Palpations: Abdomen is soft.     Tenderness: There is no abdominal tenderness.  Musculoskeletal:        General: Normal range of motion.     Cervical back: Normal range of motion and neck supple.  Lymphadenopathy:     Cervical: No cervical adenopathy.  Skin:    General: Skin is warm and dry.  Neurological:     Mental Status: She is alert and oriented to person, place, and time.  Psychiatric:        Behavior: Behavior normal.        Thought Content: Thought content normal.        Judgment: Judgment normal.       Assessment &  Plan:   Margaret Wright was seen today for establish care.  Diagnoses and all orders for this visit:  Morbid obesity ([place]) -     CBC with Differential/Platelet -     CMP14+EGFR -     Lipid panel -     Urinalysis -     VITAMIN D 25 Hydroxy (Vit-D Deficiency, Fractures) -     TSH + free T4 -     Bayer DCA Hb A1c Waived  Current smoker -     CBC with Differential/Platelet -     CMP14+EGFR -     Lipid panel -     Urinalysis -     VITAMIN D 25 Hydroxy (Vit-D Deficiency, Fractures) -     TSH + free T4 -     Bayer DCA Hb A1c Waived  Type 2 diabetes mellitus with hyperglycemia, without long-term current use of insulin (HCC) -     CBC with Differential/Platelet -     CMP14+EGFR -     Lipid panel -     Urinalysis -     VITAMIN D 25 Hydroxy (Vit-D Deficiency, Fractures) -     TSH + free T4 -     Bayer DCA Hb A1c Waived  Elevated BP without diagnosis of hypertension -     CBC with Differential/Platelet -     CMP14+EGFR -     Lipid panel -     Urinalysis -     VITAMIN D 25 Hydroxy (Vit-D Deficiency, Fractures) -     TSH + free T4 -     Bayer DCA Hb A1c Waived       I have discontinued Florentina P. Hauth "Kim"'s cyclobenzaprine, predniSONE, trimethoprim-polymyxin b, meclizine, ondansetron, and amLODipine.  Allergies as of 10/05/2022       Reactions   Aspirin Nausea And Vomiting   Morphine And Related Other (See Comments)   Personality change        Medication List        Accurate as of October 05, 2022 11:59 PM. If you have any questions, ask your nurse or doctor.          STOP taking these medications    amLODipine 5 MG tablet Commonly known as: NORVASC Stopped by: Claretta Fraise, MD   cyclobenzaprine 10 MG tablet Commonly known as: FLEXERIL Stopped by: Claretta Fraise, MD   meclizine 25 MG tablet Commonly known as: ANTIVERT Stopped by: Claretta Fraise, MD   ondansetron 8 MG disintegrating tablet Commonly known as: ZOFRAN-ODT Stopped by: Claretta Fraise,  MD   predniSONE 20 MG tablet Commonly known as: DELTASONE  Stopped by: Claretta Fraise, MD   trimethoprim-polymyxin b ophthalmic solution Commonly known as: Polytrim Stopped by: Claretta Fraise, MD         Follow-up: Return in about 1 month (around 11/05/2022), or if symptoms worsen or fail to improve.  Claretta Fraise, M.D.

## 2022-10-07 ENCOUNTER — Other Ambulatory Visit: Payer: Federal, State, Local not specified - PPO

## 2022-10-07 ENCOUNTER — Encounter: Payer: Self-pay | Admitting: Family Medicine

## 2022-10-07 DIAGNOSIS — R030 Elevated blood-pressure reading, without diagnosis of hypertension: Secondary | ICD-10-CM | POA: Diagnosis not present

## 2022-10-07 DIAGNOSIS — E1165 Type 2 diabetes mellitus with hyperglycemia: Secondary | ICD-10-CM | POA: Diagnosis not present

## 2022-10-07 DIAGNOSIS — F17200 Nicotine dependence, unspecified, uncomplicated: Secondary | ICD-10-CM | POA: Diagnosis not present

## 2022-10-07 LAB — CMP14+EGFR

## 2022-10-07 LAB — CBC WITH DIFFERENTIAL/PLATELET
Basophils Absolute: 0.1 10*3/uL (ref 0.0–0.2)
EOS (ABSOLUTE): 0.3 10*3/uL (ref 0.0–0.4)
Eos: 3 %
Hematocrit: 44.3 % (ref 34.0–46.6)
Hemoglobin: 15.1 g/dL (ref 11.1–15.9)
Immature Grans (Abs): 0 10*3/uL (ref 0.0–0.1)
MCV: 94 fL (ref 79–97)
Monocytes Absolute: 0.6 10*3/uL (ref 0.1–0.9)
Monocytes: 7 %
Neutrophils Absolute: 5.9 10*3/uL (ref 1.4–7.0)
Neutrophils: 63 %
Platelets: 195 10*3/uL (ref 150–450)
RBC: 4.74 x10E6/uL (ref 3.77–5.28)
RDW: 11.7 % (ref 11.7–15.4)
WBC: 9.3 10*3/uL (ref 3.4–10.8)

## 2022-10-07 LAB — URINALYSIS
Bilirubin, UA: NEGATIVE
Glucose, UA: NEGATIVE
Leukocytes,UA: NEGATIVE
Nitrite, UA: NEGATIVE
RBC, UA: NEGATIVE
Specific Gravity, UA: >1.03 — ABNORMAL HIGH (ref 1.005–1.030)
Urobilinogen, Ur: 0.2 mg/dL (ref 0.2–1.0)
pH, UA: 5.5 (ref 5.0–7.5)

## 2022-10-07 LAB — BAYER DCA HB A1C WAIVED: HB A1C (BAYER DCA - WAIVED): 6.6 % — ABNORMAL HIGH (ref 4.8–5.6)

## 2022-10-08 LAB — LIPID PANEL
Chol/HDL Ratio: 3.8 ratio (ref 0.0–4.4)
Cholesterol, Total: 189 mg/dL (ref 100–199)
HDL: 50 mg/dL (ref >39)
LDL Chol Calc (NIH): 118 mg/dL — ABNORMAL HIGH (ref 0–99)
Triglycerides: 120 mg/dL (ref 0–149)
VLDL Cholesterol Cal: 21 mg/dL (ref 5–40)

## 2022-10-08 LAB — CBC WITH DIFFERENTIAL/PLATELET
Basos: 1 %
Immature Granulocytes: 0 %
Lymphocytes Absolute: 2.5 10*3/uL (ref 0.7–3.1)
Lymphs: 26 %
MCH: 31.9 pg (ref 26.6–33.0)
MCHC: 34.1 g/dL (ref 31.5–35.7)

## 2022-10-08 LAB — TSH+FREE T4
Free T4: 1.07 ng/dL (ref 0.82–1.77)
TSH: 2.53 u[IU]/mL (ref 0.450–4.500)

## 2022-10-08 LAB — CMP14+EGFR
ALT: 35 IU/L — ABNORMAL HIGH (ref 0–32)
Albumin/Globulin Ratio: 1.4 (ref 1.2–2.2)
Albumin: 3.7 g/dL — ABNORMAL LOW (ref 3.9–4.9)
BUN/Creatinine Ratio: 14 (ref 9–23)
BUN: 9 mg/dL (ref 6–24)
Bilirubin Total: 0.6 mg/dL (ref 0.0–1.2)
CO2: 20 mmol/L (ref 20–29)
Chloride: 105 mmol/L (ref 96–106)
Globulin, Total: 2.6 g/dL (ref 1.5–4.5)

## 2022-10-08 LAB — VITAMIN D 25 HYDROXY (VIT D DEFICIENCY, FRACTURES): Vit D, 25-Hydroxy: 10.3 ng/mL — ABNORMAL LOW (ref 30.0–100.0)

## 2022-10-10 NOTE — Progress Notes (Signed)
Dear Selinda Flavin, Your Vitamin D is  low. You need a prescription strength supplement I will send that in for you. Nurse, if at all possible, could you send in a prescription for the patient for vitamin D 50,000 units, 1 p.o. weekly #13 with 3 refills? Many thanks, WS

## 2022-10-11 ENCOUNTER — Other Ambulatory Visit: Payer: Self-pay | Admitting: Family Medicine

## 2022-10-11 MED ORDER — VITAMIN D (ERGOCALCIFEROL) 1.25 MG (50000 UNIT) PO CAPS: 50000.0000 [IU] | ORAL_CAPSULE | ORAL | 3 refills | Status: AC

## 2022-10-19 ENCOUNTER — Ambulatory Visit (INDEPENDENT_AMBULATORY_CARE_PROVIDER_SITE_OTHER): Payer: Federal, State, Local not specified - PPO

## 2022-10-19 ENCOUNTER — Encounter: Payer: Self-pay | Admitting: Family Medicine

## 2022-10-19 ENCOUNTER — Ambulatory Visit: Payer: Federal, State, Local not specified - PPO | Admitting: Family Medicine

## 2022-10-19 VITALS — BP 136/84 | HR 83 | Temp 98.0°F | Ht 68.0 in | Wt 298.4 lb

## 2022-10-19 DIAGNOSIS — M25571 Pain in right ankle and joints of right foot: Secondary | ICD-10-CM | POA: Diagnosis not present

## 2022-10-19 DIAGNOSIS — W19XXXA Unspecified fall, initial encounter: Secondary | ICD-10-CM | POA: Diagnosis not present

## 2022-10-19 DIAGNOSIS — E1165 Type 2 diabetes mellitus with hyperglycemia: Secondary | ICD-10-CM

## 2022-10-19 DIAGNOSIS — M79604 Pain in right leg: Secondary | ICD-10-CM | POA: Diagnosis not present

## 2022-10-19 DIAGNOSIS — M25551 Pain in right hip: Secondary | ICD-10-CM | POA: Diagnosis not present

## 2022-10-19 DIAGNOSIS — M25561 Pain in right knee: Secondary | ICD-10-CM | POA: Diagnosis not present

## 2022-10-19 MED ORDER — KETOROLAC TROMETHAMINE 60 MG/2ML IM SOLN
60.0000 mg | Freq: Once | INTRAMUSCULAR | Status: AC
  Administered 2022-10-19: 60 mg via INTRAMUSCULAR

## 2022-10-19 MED ORDER — METFORMIN HCL ER 500 MG PO TB24: 500.0000 mg | ORAL_TABLET | Freq: Every day | ORAL | 2 refills | Status: DC

## 2022-10-19 MED ORDER — TIZANIDINE HCL 4 MG PO TABS: 4.0000 mg | ORAL_TABLET | Freq: Four times a day (QID) | ORAL | 1 refills | Status: DC | PRN

## 2022-10-19 MED ORDER — DICLOFENAC SODIUM 75 MG PO TBEC: 75.0000 mg | DELAYED_RELEASE_TABLET | Freq: Two times a day (BID) | ORAL | 2 refills | Status: DC

## 2022-10-19 MED ORDER — BLOOD GLUCOSE MONITOR KIT: PACK | 99 refills | Status: AC
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
## 2022-10-23 ENCOUNTER — Encounter: Payer: Self-pay | Admitting: Family Medicine

## 2022-11-02 ENCOUNTER — Encounter: Payer: Self-pay | Admitting: Family Medicine

## 2022-11-07 ENCOUNTER — Encounter: Payer: Self-pay | Admitting: Family Medicine

## 2022-12-13 ENCOUNTER — Telehealth: Payer: Federal, State, Local not specified - PPO | Admitting: Family Medicine

## 2022-12-13 ENCOUNTER — Encounter: Payer: Self-pay | Admitting: Family Medicine

## 2022-12-13 DIAGNOSIS — J111 Influenza due to unidentified influenza virus with other respiratory manifestations: Secondary | ICD-10-CM

## 2022-12-13 MED ORDER — OSELTAMIVIR PHOSPHATE 75 MG PO CAPS: 75.0000 mg | ORAL_CAPSULE | Freq: Two times a day (BID) | ORAL | 0 refills | Status: DC

## 2022-12-13 NOTE — Progress Notes (Signed)
Subjective:    Patient ID: Margaret Wright, female    DOB: 1976-02-28, 47 y.o.   MRN: [id number]   HPI: Margaret Wright is a 47 y.o. female presenting for onset 2 days ago. High fever. Achy, congested. Cough that is dry. Brown to orange rhino. Has posterior drainage. Ears hurt. At home Covid test negative.       10/19/2022   11:01 AM 10/19/2022    8:56 AM 10/05/2022    3:22 PM 07/16/2018    4:39 PM 01/17/2017    9:04 AM  Depression screen PHQ 2/9  Decreased Interest 1 1 1  0 0  Down, Depressed, Hopeless 1 1 1  0 0  PHQ - 2 Score 2 2 2  0 0  Altered sleeping 2 2 0    Tired, decreased energy 2 2 2     Change in appetite 1 1 3     Feeling bad or failure about yourself  1 1 1     Trouble concentrating 2 2 0    Moving slowly or fidgety/restless 1 1 1     Suicidal thoughts 0 0 1    PHQ-9 Score 11 11 10     Difficult doing work/chores Not difficult at all Not difficult at all Somewhat difficult       Relevant past medical, surgical, family and social history reviewed and updated as indicated.  Interim medical history since our last visit reviewed. Allergies and medications reviewed and updated.  ROS:  Review of Systems  Constitutional:  Positive for appetite change, chills and fever.  HENT:  Positive for congestion and rhinorrhea. Negative for ear pain, nosebleeds, postnasal drip, sinus pressure and sore throat.   Respiratory:  Negative for chest tightness and shortness of breath.   Cardiovascular:  Negative for chest pain.  Musculoskeletal:  Positive for myalgias.  Skin:  Negative for rash.  Neurological:  Positive for headaches.     Social History   Tobacco Use  Smoking Status Every Day   Packs/day: 1.00   Years: 15.00   Total pack years: 15.00   Types: Cigarettes  Smokeless Tobacco Never       Objective:     Wt Readings from Last 3 Encounters:  10/19/22 298 lb 6.4 oz (135.4 kg)  10/05/22 296 lb (134.3 kg)  09/02/22 290 lb (131.5 kg)     Exam deferred.  Pt. Harboring due to COVID 19. Phone visit performed.   Assessment & Plan:   1. Influenza with respiratory manifestation     Meds ordered this encounter  Medications   oseltamivir (TAMIFLU) 75 MG capsule    Sig: Take 1 capsule (75 mg total) by mouth 2 (two) times daily.    Dispense:  10 capsule    Refill:  0    No orders of the defined types were placed in this encounter.     Diagnoses and all orders for this visit:  Influenza with respiratory manifestation  Other orders -     oseltamivir (TAMIFLU) 75 MG capsule; Take 1 capsule (75 mg total) by mouth 2 (two) times daily.    Virtual Visit via telephone Note  I discussed the limitations, risks, security and privacy concerns of performing an evaluation and management service by telephone and the availability of in person appointments. The patient was identified with two identifiers. Pt.expressed understanding and agreed to proceed. Pt. Is at home. Dr. Livia Snellen is in his office.  Follow Up Instructions:   I discussed the assessment and treatment plan with the patient.  The patient was provided an opportunity to ask questions and all were answered. The patient agreed with the plan and demonstrated an understanding of the instructions.   The patient was advised to call back or seek an in-person evaluation if the symptoms worsen or if the condition fails to improve as anticipated.   Total minutes including chart review and phone contact time: 11   Follow up plan: Return if symptoms worsen or fail to improve.  Claretta Fraise, MD [place]

## 2022-12-20 ENCOUNTER — Ambulatory Visit: Payer: Federal, State, Local not specified - PPO | Admitting: Family Medicine

## 2022-12-22 ENCOUNTER — Encounter: Payer: Self-pay | Admitting: Family Medicine

## 2022-12-26 ENCOUNTER — Ambulatory Visit: Payer: Federal, State, Local not specified - PPO | Admitting: Family Medicine

## 2022-12-29 ENCOUNTER — Encounter: Payer: Self-pay | Admitting: Family Medicine

## 2023-01-25 ENCOUNTER — Ambulatory Visit (INDEPENDENT_AMBULATORY_CARE_PROVIDER_SITE_OTHER): Payer: 59 | Admitting: Family Medicine

## 2023-01-25 ENCOUNTER — Other Ambulatory Visit: Payer: Self-pay | Admitting: Family Medicine

## 2023-01-25 ENCOUNTER — Encounter: Payer: Self-pay | Admitting: Family Medicine

## 2023-01-25 VITALS — BP 128/85 | HR 82 | Temp 98.1°F | Ht 68.0 in | Wt 297.2 lb

## 2023-01-25 DIAGNOSIS — E1165 Type 2 diabetes mellitus with hyperglycemia: Secondary | ICD-10-CM

## 2023-01-25 DIAGNOSIS — E7800 Pure hypercholesterolemia, unspecified: Secondary | ICD-10-CM

## 2023-01-25 LAB — BAYER DCA HB A1C WAIVED: HB A1C (BAYER DCA - WAIVED): 6.5 % — ABNORMAL HIGH (ref 4.8–5.6)

## 2023-01-25 MED ORDER — TIZANIDINE HCL 4 MG PO TABS: 4.0000 mg | ORAL_TABLET | Freq: Four times a day (QID) | ORAL | 1 refills | Status: DC | PRN

## 2023-01-25 MED ORDER — PREGABALIN 50 MG PO CAPS: ORAL_CAPSULE | ORAL | 1 refills | Status: DC

## 2023-01-25 MED ORDER — METFORMIN HCL ER 500 MG PO TB24: 500.0000 mg | ORAL_TABLET | Freq: Every day | ORAL | 3 refills | Status: DC

## 2023-01-25 MED ORDER — DICLOFENAC SODIUM 75 MG PO TBEC: 75.0000 mg | DELAYED_RELEASE_TABLET | Freq: Two times a day (BID) | ORAL | 2 refills | Status: DC

## 2023-01-25 NOTE — Telephone Encounter (Signed)
  Prescription Request  01/25/2023  Is this a "Controlled Substance" medicine?   Have you seen your PCP in the last 2 weeks? Seen 2/14  If YES, route message to pool  -  If NO, patient needs to be scheduled for appointment.  What is the name of the medication or equipment? diclofenac (VOLTAREN) 75 MG EC tablet metFORMIN (GLUCOPHAGE-XR) 500 MG 24 hr tablet tiZANidine (ZANAFLEX) 4 MG tablet  Lyrica - new med that was discussed with PCP today   Have you contacted your pharmacy to request a refill? Yes, sent to wrong pharmacy per pt    Which pharmacy would you like this sent to?  [facility], [place]       Patient notified that their request is being sent to the clinical staff for review and that they should receive a response within 2 business days.

## 2023-01-25 NOTE — Progress Notes (Signed)
Subjective:  Patient ID: Margaret Wright, female    DOB: [birth year]  Age: 47 y.o. MRN: [id number]  CC: Medical Management of Chronic Issues   HPI Margaret Wright presents for having Covid fogginess. Home test positive last month. Pain more severe since.Entire body hurts daily. - back, knees, hands., etc. Was off metformin as a result until 1 1/2 weeks ago.      01/25/2023   10:55 AM 01/25/2023   10:46 AM 10/19/2022   11:01 AM  Depression screen PHQ 2/9  Decreased Interest 1 0 1  Down, Depressed, Hopeless 1 0 1  PHQ - 2 Score 2 0 2  Altered sleeping 2  2  Tired, decreased energy 2  2  Change in appetite 1  1  Feeling bad or failure about yourself  1  1  Trouble concentrating 2  2  Moving slowly or fidgety/restless 0  1  Suicidal thoughts 0  0  PHQ-9 Score 10  11  Difficult doing work/chores Somewhat difficult  Not difficult at all    History Margaret Wright has a past medical history of Anxiety, Diabetes mellitus without complication ([place]), Migraines, and Sciatica.   She has a past surgical history that includes Tonsillectomy; Appendectomy; Cholecystectomy; orthopedic surgery; and Novasure ablation.   Her family history includes Diabetes in her mother; Epilepsy in her son; Heart disease in her father; Lung disease in her father.She reports that she has been smoking cigarettes. She has a 15.00 pack-year smoking history. She has never used smokeless tobacco. She reports current alcohol use. She reports current drug use. Frequency: 3.00 times per week. Drug: Marijuana.    ROS Review of Systems  Constitutional: Negative.   HENT: Negative.    Eyes:  Negative for visual disturbance.  Respiratory:  Negative for shortness of breath.   Cardiovascular:  Negative for chest pain.  Gastrointestinal:  Negative for abdominal pain.  Musculoskeletal:  Negative for arthralgias.    Objective:  BP 128/85   Pulse 82   Temp 98.1 F (36.7 C)   Ht 5' 8"$  (1.727 m)   Wt 297 lb 3.2 oz  (134.8 kg)   SpO2 98%   BMI 45.19 kg/m   BP Readings from Last 3 Encounters:  01/25/23 128/85  10/19/22 136/84  10/05/22 133/86    Wt Readings from Last 3 Encounters:  01/25/23 297 lb 3.2 oz (134.8 kg)  10/19/22 298 lb 6.4 oz (135.4 kg)  10/05/22 296 lb (134.3 kg)     Physical Exam Constitutional:      General: She is not in acute distress.    Appearance: She is well-developed.  Cardiovascular:     Rate and Rhythm: Normal rate and regular rhythm.  Pulmonary:     Breath sounds: Normal breath sounds.  Musculoskeletal:        General: Normal range of motion.  Skin:    General: Skin is warm and dry.  Neurological:     Mental Status: She is alert and oriented to person, place, and time.       Assessment & Plan:   Margaret Wright was seen today for medical management of chronic issues.  Diagnoses and all orders for this visit:  Type 2 diabetes mellitus with hyperglycemia, without long-term current use of insulin (HCC) -     Bayer DCA Hb A1c Waived -     Cancel: CBC with Differential/Platelet -     Cancel: CMP14+EGFR -     Microalbumin / creatinine urine ratio  Elevated LDL cholesterol  level -     Cancel: Lipid panel  Other orders -     metFORMIN (GLUCOPHAGE-XR) 500 MG 24 hr tablet; Take 1 tablet (500 mg total) by mouth daily with breakfast. -     tiZANidine (ZANAFLEX) 4 MG tablet; Take 1 tablet (4 mg total) by mouth every 6 (six) hours as needed for muscle spasms. -     diclofenac (VOLTAREN) 75 MG EC tablet; Take 1 tablet (75 mg total) by mouth 2 (two) times daily. For muscle and  Joint pain -     pregabalin (LYRICA) 50 MG capsule; 1 qhs X7 days , then 2 qhs X 7d, then 3 qhs X 7d, then 4 qhs       I have discontinued Margaret Wright "Margaret Wright"'s oseltamivir. I am also having her start on pregabalin. Additionally, I am having her maintain her Vitamin D (Ergocalciferol), blood glucose meter kit and supplies, metFORMIN, tiZANidine, and diclofenac.  Allergies as of  01/25/2023       Reactions   Aspirin Nausea And Vomiting   Morphine And Related Other (See Comments)   Personality change        Medication List        Accurate as of January 25, 2023  8:44 PM. If you have any questions, ask your nurse or doctor.          STOP taking these medications    oseltamivir 75 MG capsule Commonly known as: Tamiflu Stopped by: Margaret Fraise, MD       TAKE these medications    blood glucose meter kit and supplies Kit Dispense based on patient and insurance preference. Use up to four times daily as directed. (FOR ICD-10 : E11.9   diclofenac 75 MG EC tablet Commonly known as: VOLTAREN Take 1 tablet (75 mg total) by mouth 2 (two) times daily. For muscle and  Joint pain   metFORMIN 500 MG 24 hr tablet Commonly known as: GLUCOPHAGE-XR Take 1 tablet (500 mg total) by mouth daily with breakfast.   pregabalin 50 MG capsule Commonly known as: Lyrica 1 qhs X7 days , then 2 qhs X 7d, then 3 qhs X 7d, then 4 qhs Started by: Margaret Fraise, MD   tiZANidine 4 MG tablet Commonly known as: ZANAFLEX Take 1 tablet (4 mg total) by mouth every 6 (six) hours as needed for muscle spasms.   Vitamin D (Ergocalciferol) 1.25 MG (50000 UNIT) Caps capsule Commonly known as: DRISDOL Take 1 capsule (50,000 Units total) by mouth every 7 (seven) days.         Follow-up: Return in about 3 months (around 04/25/2023).  Margaret Wright, M.D.

## 2023-01-26 MED ORDER — PREGABALIN 50 MG PO CAPS: ORAL_CAPSULE | ORAL | 1 refills | Status: DC

## 2023-01-26 MED ORDER — DICLOFENAC SODIUM 75 MG PO TBEC: 75.0000 mg | DELAYED_RELEASE_TABLET | Freq: Two times a day (BID) | ORAL | 2 refills | Status: AC

## 2023-01-26 MED ORDER — TIZANIDINE HCL 4 MG PO TABS: 4.0000 mg | ORAL_TABLET | Freq: Four times a day (QID) | ORAL | 1 refills | Status: DC | PRN

## 2023-01-26 MED ORDER — METFORMIN HCL ER 500 MG PO TB24: 500.0000 mg | ORAL_TABLET | Freq: Every day | ORAL | 3 refills | Status: AC

## 2023-01-26 NOTE — Telephone Encounter (Signed)
Pt is using [employer] [facility], she thought [facility] had been taken out, I sent Diclofenac, Metformin & Tizanidine to [facility].  Can you please send her Pregabalin to [facility]

## 2023-04-25 ENCOUNTER — Ambulatory Visit: Payer: 59 | Admitting: Family Medicine

## 2023-05-15 ENCOUNTER — Other Ambulatory Visit: Payer: Self-pay | Admitting: Family Medicine

## 2023-05-23 ENCOUNTER — Ambulatory Visit (INDEPENDENT_AMBULATORY_CARE_PROVIDER_SITE_OTHER): Payer: 59 | Admitting: Family Medicine

## 2023-05-23 ENCOUNTER — Encounter: Payer: Self-pay | Admitting: Family Medicine

## 2023-05-23 VITALS — BP 117/77 | HR 88 | Temp 97.2°F | Ht 68.0 in | Wt 277.8 lb

## 2023-05-23 DIAGNOSIS — R34 Anuria and oliguria: Secondary | ICD-10-CM | POA: Diagnosis not present

## 2023-05-23 DIAGNOSIS — Z7984 Long term (current) use of oral hypoglycemic drugs: Secondary | ICD-10-CM | POA: Diagnosis not present

## 2023-05-23 DIAGNOSIS — E7800 Pure hypercholesterolemia, unspecified: Secondary | ICD-10-CM

## 2023-05-23 DIAGNOSIS — E11649 Type 2 diabetes mellitus with hypoglycemia without coma: Secondary | ICD-10-CM | POA: Diagnosis not present

## 2023-05-23 LAB — BAYER DCA HB A1C WAIVED: HB A1C (BAYER DCA - WAIVED): 6.2 % — ABNORMAL HIGH (ref 4.8–5.6)

## 2023-05-23 LAB — URINALYSIS
Bilirubin, UA: NEGATIVE
Glucose, UA: NEGATIVE
Leukocytes,UA: NEGATIVE
Nitrite, UA: NEGATIVE
RBC, UA: NEGATIVE
Specific Gravity, UA: >1.03 — ABNORMAL HIGH (ref 1.005–1.030)
Urobilinogen, Ur: 1 mg/dL (ref 0.2–1.0)
pH, UA: 5.5 (ref 5.0–7.5)

## 2023-05-23 MED ORDER — PREGABALIN 150 MG PO CAPS: 150.0000 mg | ORAL_CAPSULE | Freq: Every day | ORAL | 1 refills | Status: DC

## 2023-05-23 MED ORDER — ONDANSETRON 8 MG PO TBDP: 8.0000 mg | ORAL_TABLET | Freq: Four times a day (QID) | ORAL | 1 refills | Status: AC | PRN

## 2023-05-23 NOTE — Progress Notes (Signed)
Subjective:  Patient ID: Margaret Wright, female    DOB: Mar 15, 1976  Age: 47 y.o. MRN: [id number]  CC: Medical Management of Chronic Issues   HPI Margaret Wright presents forFollow-up of diabetes. Patient checks blood sugar at home.  Patient denies symptoms such as polyuria, polydipsia, excessive hunger, nausea No significant hypoglycemic spells noted. Bottoms to the point Margaret Wright almost passes out. Insides shaking.  Medications reviewed. Pt reports taking them regularly without complication/adverse reaction being reported today.  Got overheated recently. Spent to much time in the sun at the pool and got dehydrated. Has been vomiting, can't hold water down ever since.   History Margaret Wright has a past medical history of Anxiety, Diabetes mellitus without complication (HCC), Migraines, and Sciatica.   Margaret Wright has a past surgical history that includes Tonsillectomy; Appendectomy; Cholecystectomy; orthopedic surgery; and Novasure ablation.   Her family history includes Diabetes in her mother; Epilepsy in her son; Heart disease in her father; Lung disease in her father.Margaret Wright reports that Margaret Wright has been smoking cigarettes. Margaret Wright has a 15.00 pack-year smoking history. Margaret Wright has never used smokeless tobacco. Margaret Wright reports current alcohol use. Margaret Wright reports current drug use. Frequency: 3.00 times per week. Drug: Marijuana.  Current Outpatient Medications on File Prior to Visit  Medication Sig Dispense Refill   blood glucose meter kit and supplies KIT Dispense based on patient and insurance preference. Use up to four times daily as directed. (FOR ICD-10 : E11.9 1 each PRN   diclofenac (VOLTAREN) 75 MG EC tablet Take 1 tablet (75 mg total) by mouth 2 (two) times daily. For muscle and  Joint pain 60 tablet 2   metFORMIN (GLUCOPHAGE-XR) 500 MG 24 hr tablet Take 1 tablet (500 mg total) by mouth daily with breakfast. 90 tablet 3   tiZANidine (ZANAFLEX) 4 MG tablet TAKE ONE TABLET EVERY 6 HOURS AS NEEDED FOR MUSCLE  SPASMS 30 tablet 5   Vitamin D, Ergocalciferol, (DRISDOL) 1.25 MG (50000 UNIT) CAPS capsule Take 1 capsule (50,000 Units total) by mouth every 7 (seven) days. 13 capsule 3   No current facility-administered medications on file prior to visit.    ROS Review of Systems  Constitutional: Negative.   HENT: Negative.    Eyes:  Negative for visual disturbance.  Respiratory:  Negative for shortness of breath.   Cardiovascular:  Negative for chest pain.  Gastrointestinal:  Positive for nausea and vomiting.  Musculoskeletal:  Negative for arthralgias.    Objective:  BP 117/77   Pulse 88   Temp (!) 97.2 F (36.2 C)   Ht 5\' 8"  (1.727 m)   Wt 277 lb 12.8 oz (126 kg)   SpO2 98%   BMI 42.24 kg/m   BP Readings from Last 3 Encounters:  05/23/23 117/77  01/25/23 128/85  10/19/22 136/84    Wt Readings from Last 3 Encounters:  05/23/23 277 lb 12.8 oz (126 kg)  01/25/23 297 lb 3.2 oz (134.8 kg)  10/19/22 298 lb 6.4 oz (135.4 kg)     Physical Exam Constitutional:      General: Margaret Wright is not in acute distress.    Appearance: Margaret Wright is well-developed.  HENT:     Head: Normocephalic and atraumatic.  Eyes:     Conjunctiva/sclera: Conjunctivae normal.     Pupils: Pupils are equal, round, and reactive to light.  Neck:     Thyroid: No thyromegaly.  Cardiovascular:     Rate and Rhythm: Normal rate and regular rhythm.     Heart sounds: Normal heart  sounds. No murmur heard. Pulmonary:     Effort: Pulmonary effort is normal. No respiratory distress.     Breath sounds: Normal breath sounds. No wheezing or rales.  Abdominal:     General: Bowel sounds are normal. There is no distension.     Palpations: Abdomen is soft.     Tenderness: There is no abdominal tenderness.  Musculoskeletal:        General: Normal range of motion.     Cervical back: Normal range of motion and neck supple.  Lymphadenopathy:     Cervical: No cervical adenopathy.  Skin:    General: Skin is warm and dry.   Neurological:     Mental Status: Margaret Wright is alert and oriented to person, place, and time.  Psychiatric:        Behavior: Behavior normal.        Thought Content: Thought content normal.        Judgment: Judgment normal.       Assessment & Plan:   Margaret Wright was seen today for medical management of chronic issues.  Diagnoses and all orders for this visit:  Diabetic hypoglycemia (HCC) -     Bayer DCA Hb A1c Waived -     CBC with Differential/Platelet -     CMP14+EGFR  Elevated LDL cholesterol level -     Lipid panel  Oliguria -     Urine Culture -     Urinalysis  Other orders -     ondansetron (ZOFRAN-ODT) 8 MG disintegrating tablet; Take 1 tablet (8 mg total) by mouth every 6 (six) hours as needed for nausea or vomiting. -     pregabalin (LYRICA) 150 MG capsule; Take 1 capsule (150 mg total) by mouth daily.      I have changed Margaret P. Cisse "Kim"'s pregabalin. I am also having her start on ondansetron. Additionally, I am having her maintain her Vitamin D (Ergocalciferol), blood glucose meter kit and supplies, diclofenac, metFORMIN, and tiZANidine.  Meds ordered this encounter  Medications   ondansetron (ZOFRAN-ODT) 8 MG disintegrating tablet    Sig: Take 1 tablet (8 mg total) by mouth every 6 (six) hours as needed for nausea or vomiting.    Dispense:  20 tablet    Refill:  1   pregabalin (LYRICA) 150 MG capsule    Sig: Take 1 capsule (150 mg total) by mouth daily.    Dispense:  90 capsule    Refill:  1     Follow-up: Return in about 3 months (around 08/23/2023), or if symptoms worsen or fail to improve.  Mechele Claude, M.D.

## 2023-05-24 LAB — CBC WITH DIFFERENTIAL/PLATELET
Basophils Absolute: 0.1 10*3/uL (ref 0.0–0.2)
Basos: 1 %
EOS (ABSOLUTE): 0.2 10*3/uL (ref 0.0–0.4)
Eos: 2 %
Hematocrit: 47.7 % — ABNORMAL HIGH (ref 34.0–46.6)
Hemoglobin: 16.3 g/dL — ABNORMAL HIGH (ref 11.1–15.9)
Immature Grans (Abs): 0 10*3/uL (ref 0.0–0.1)
Immature Granulocytes: 0 %
Lymphocytes Absolute: 1.9 10*3/uL (ref 0.7–3.1)
Lymphs: 19 %
MCH: 32 pg (ref 26.6–33.0)
MCHC: 34.2 g/dL (ref 31.5–35.7)
MCV: 94 fL (ref 79–97)
Monocytes Absolute: 0.8 10*3/uL (ref 0.1–0.9)
Monocytes: 8 %
Neutrophils Absolute: 7.1 10*3/uL — ABNORMAL HIGH (ref 1.4–7.0)
Neutrophils: 70 %
Platelets: 226 10*3/uL (ref 150–450)
RBC: 5.1 x10E6/uL (ref 3.77–5.28)
RDW: 11.3 % — ABNORMAL LOW (ref 11.7–15.4)
WBC: 10.1 10*3/uL (ref 3.4–10.8)

## 2023-05-24 LAB — CMP14+EGFR
ALT: 46 IU/L — ABNORMAL HIGH (ref 0–32)
AST: 42 IU/L — ABNORMAL HIGH (ref 0–40)
Albumin/Globulin Ratio: 1.4
Albumin: 4.1 g/dL (ref 3.9–4.9)
Alkaline Phosphatase: 86 IU/L (ref 44–121)
BUN/Creatinine Ratio: 16 (ref 9–23)
BUN: 12 mg/dL (ref 6–24)
Bilirubin Total: 0.5 mg/dL (ref 0.0–1.2)
CO2: 23 mmol/L (ref 20–29)
Calcium: 9.3 mg/dL (ref 8.7–10.2)
Chloride: 104 mmol/L (ref 96–106)
Creatinine, Ser: 0.76 mg/dL (ref 0.57–1.00)
Globulin, Total: 2.9 g/dL (ref 1.5–4.5)
Glucose: 153 mg/dL — ABNORMAL HIGH (ref 70–99)
Potassium: 4.2 mmol/L (ref 3.5–5.2)
Sodium: 139 mmol/L (ref 134–144)
Total Protein: 7 g/dL (ref 6.0–8.5)
eGFR: 97 mL/min/{1.73_m2} (ref >59)

## 2023-05-24 LAB — LIPID PANEL
Chol/HDL Ratio: 4.6 ratio — ABNORMAL HIGH (ref 0.0–4.4)
Cholesterol, Total: 207 mg/dL — ABNORMAL HIGH (ref 100–199)
HDL: 45 mg/dL (ref >39)
LDL Chol Calc (NIH): 145 mg/dL — ABNORMAL HIGH (ref 0–99)
Triglycerides: 96 mg/dL (ref 0–149)
VLDL Cholesterol Cal: 17 mg/dL (ref 5–40)

## 2023-05-25 LAB — URINE CULTURE

## 2023-05-29 ENCOUNTER — Other Ambulatory Visit: Payer: Self-pay | Admitting: Family Medicine

## 2023-05-29 MED ORDER — ROSUVASTATIN CALCIUM 5 MG PO TABS: 5.0000 mg | ORAL_TABLET | Freq: Every day | ORAL | 3 refills | Status: AC

## 2023-08-30 ENCOUNTER — Ambulatory Visit: Payer: 59

## 2023-08-30 DIAGNOSIS — E1165 Type 2 diabetes mellitus with hyperglycemia: Secondary | ICD-10-CM

## 2023-08-30 NOTE — Progress Notes (Signed)
Margaret Wright arrived 08/30/2023 and has given verbal consent to obtain images and complete their overdue diabetic retinal screening.  The images have been sent to an ophthalmologist or optometrist for review and interpretation.  Results will be sent back to Mechele Claude, MD for review.  Patient has been informed they will be contacted when we receive the results via telephone or MyChart

## 2023-11-18 ENCOUNTER — Other Ambulatory Visit: Payer: Self-pay | Admitting: Family Medicine

## 2023-12-02 ENCOUNTER — Emergency Department (HOSPITAL_COMMUNITY)
Admission: EM | Admit: 2023-12-02 | Discharge: 2023-12-02 | Disposition: A | Discharge status: Home / Self Care | Payer: 59 | Attending: Emergency Medicine | Admitting: Emergency Medicine

## 2023-12-02 ENCOUNTER — Other Ambulatory Visit: Payer: Self-pay

## 2023-12-02 ENCOUNTER — Encounter (HOSPITAL_COMMUNITY): Payer: Self-pay | Admitting: Emergency Medicine

## 2023-12-02 DIAGNOSIS — M5441 Lumbago with sciatica, right side: Secondary | ICD-10-CM | POA: Insufficient documentation

## 2023-12-02 DIAGNOSIS — M545 Low back pain, unspecified: Secondary | ICD-10-CM | POA: Diagnosis present

## 2023-12-02 MED ORDER — DICLOFENAC EPOLAMINE 1.3 % EX PTCH
1.0000 | MEDICATED_PATCH | Freq: Two times a day (BID) | CUTANEOUS | 1 refills | Status: AC
Start: 2023-12-02 — End: ?

## 2023-12-02 MED ORDER — DEXAMETHASONE SODIUM PHOSPHATE 10 MG/ML IJ SOLN
10.0000 mg | Freq: Once | INTRAMUSCULAR | Status: AC
  Administered 2023-12-02: 10 mg via INTRAMUSCULAR
  Filled 2023-12-02: qty 1

## 2023-12-02 MED ORDER — DICLOFENAC EPOLAMINE 1.3 % EX PTCH
1.0000 | MEDICATED_PATCH | Freq: Two times a day (BID) | CUTANEOUS | Status: DC
  Administered 2023-12-02: 1 via TRANSDERMAL
  Filled 2023-12-02: qty 1

## 2023-12-02 MED ORDER — PREDNISONE 20 MG PO TABS: 40.0000 mg | ORAL_TABLET | Freq: Every day | ORAL | 0 refills | Status: DC

## 2023-12-02 MED ORDER — KETOROLAC TROMETHAMINE 60 MG/2ML IM SOLN
15.0000 mg | Freq: Once | INTRAMUSCULAR | Status: AC
  Administered 2023-12-02: 15 mg via INTRAMUSCULAR
  Filled 2023-12-02: qty 2

## 2023-12-02 NOTE — Discharge Instructions (Signed)
Today's value patient has been generally reassuring.  Please obtain and use your prescribed medication.  If the diclofenac patches are not available, or are prohibitively expensive, a Salonpas over-the-counter alternative is available.  Please ask the pharmacist.  In addition to these medications, please use Aleve, twice daily.  Return here for concerning changes in your condition.

## 2023-12-02 NOTE — ED Triage Notes (Addendum)
Pt reports lower right sided back pain that started after hugging her child yesterday. Pt reports at 3am when she woke up she was unable to move due to the pain in her back. Pt reports taking robaxin PTA.

## 2023-12-02 NOTE — ED Notes (Signed)
Patient given something to drink.

## 2023-12-02 NOTE — ED Provider Notes (Signed)
[place] EMERGENCY DEPARTMENT AT [facility] Provider Note   CSN: [id number] Arrival date & time: 12/02/23  4098     History  Chief Complaint  Patient presents with   Back Pain    Margaret Wright is a 47 y.o. female.  HPI Patient with 25 years of intermittent back pain now presents with right low back pain radiating down her right leg.  She notes that she was hugging her adult child yesterday, felt sudden onset of pain.  Since that time pain has been persistent right lower back, worse with ambulation, moving her legs.  No abdominal pain, no vomiting, no fever, no inability to urinate.  No relief with OTC medication.    Home Medications Prior to Admission medications   Medication Sig Start Date End Date Taking? Authorizing Provider  diclofenac (FLECTOR) 1.3 % PTCH Place 1 patch onto the skin 2 (two) times daily. 12/02/23  Yes Gerhard Munch, MD  predniSONE (DELTASONE) 20 MG tablet Take 2 tablets (40 mg total) by mouth daily with breakfast. For the next four days 12/02/23  Yes Gerhard Munch, MD  blood glucose meter kit and supplies KIT Dispense based on patient and insurance preference. Use up to four times daily as directed. (FOR ICD-10 : E11.9 10/19/22   Mechele Claude, MD  diclofenac (VOLTAREN) 75 MG EC tablet Take 1 tablet (75 mg total) by mouth 2 (two) times daily. For muscle and  Joint pain 01/26/23   Mechele Claude, MD  metFORMIN (GLUCOPHAGE-XR) 500 MG 24 hr tablet Take 1 tablet (500 mg total) by mouth daily with breakfast. 01/26/23   Mechele Claude, MD  ondansetron (ZOFRAN-ODT) 8 MG disintegrating tablet Take 1 tablet (8 mg total) by mouth every 6 (six) hours as needed for nausea or vomiting. 05/23/23   Mechele Claude, MD  pregabalin (LYRICA) 150 MG capsule Take 1 capsule (150 mg total) by mouth daily. 05/23/23   Mechele Claude, MD  rosuvastatin (CRESTOR) 5 MG tablet Take 1 tablet (5 mg total) by mouth daily. For cholesterol 05/29/23   Mechele Claude, MD   tiZANidine (ZANAFLEX) 4 MG tablet TAKE ONE TABLET EVERY 6 HOURS AS NEEDED FOR MUSCLE SPASMS 05/15/23   Mechele Claude, MD  Vitamin D, Ergocalciferol, (DRISDOL) 1.25 MG (50000 UNIT) CAPS capsule Take 1 capsule (50,000 Units total) by mouth every 7 (seven) days. 10/11/22   Mechele Claude, MD      Allergies    Aspirin and Morphine and codeine    Review of Systems   Review of Systems  Physical Exam Updated Vital Signs BP 126/88   Pulse 67   Temp 98.4 F (36.9 C) (Oral)   Resp 17   Ht 5\' 8"  (1.727 m)   Wt 119.3 kg   SpO2 98%   BMI 39.99 kg/m  Physical Exam Vitals and nursing note reviewed.  Constitutional:      General: She is not in acute distress.    Appearance: She is well-developed.  HENT:     Head: Normocephalic and atraumatic.  Eyes:     Conjunctiva/sclera: Conjunctivae normal.  Cardiovascular:     Rate and Rhythm: Normal rate and regular rhythm.     Pulses: Normal pulses.  Pulmonary:     Effort: No respiratory distress.     Breath sounds: No stridor.  Abdominal:     General: There is no distension.  Musculoskeletal:       Arms:  Skin:    General: Skin is warm and dry.  Neurological:  Mental Status: She is alert and oriented to person, place, and time.     Cranial Nerves: No cranial nerve deficit.  Psychiatric:        Mood and Affect: Mood normal.     ED Results / Procedures / Treatments   Labs (all labs ordered are listed, but only abnormal results are displayed) Labs Reviewed - No data to display  EKG None  Radiology No results found.  Procedures Procedures    Medications Ordered in ED Medications  diclofenac (FLECTOR) 1.3 % 1 patch (1 patch Transdermal Patch Applied 12/02/23 0828)  ketorolac (TORADOL) injection 15 mg (15 mg Intramuscular Given 12/02/23 0831)  dexamethasone (DECADRON) injection 10 mg (10 mg Intramuscular Given 12/02/23 8295)    ED Course/ Medical Decision Making/ A&P                                 Medical Decision  Making Adult female with episodic back pain presents with right low back pain rating down the right leg.  Absent abdominal pain, incontinence or retention little evidence for cauda equina.  Suspicion for acute on chronic low back pain, though renal, or GU pathology also considered.  Amount and/or Complexity of Data Reviewed External Data Reviewed: notes.  Risk Prescription drug management.   9:43 AM On repeat exam patient is awake, alert, sitting upright, in no distress, much more comfortable than on arrival.  She remains hemodynamically unremarkable, no new complaints, suspicion for acute on chronic pain with sciatica, no red flags suggesting CNS involvement, no evidence for infection, patient started on meds, will follow-up with primary care.        Final Clinical Impression(s) / ED Diagnoses Final diagnoses:  Acute right-sided low back pain with right-sided sciatica    Rx / DC Orders ED Discharge Orders          Ordered    predniSONE (DELTASONE) 20 MG tablet  Daily with breakfast        12/02/23 0942    diclofenac (FLECTOR) 1.3 % PTCH  2 times daily        12/02/23 0942              Gerhard Munch, MD 12/02/23 [phone]

## 2023-12-25 ENCOUNTER — Ambulatory Visit (INDEPENDENT_AMBULATORY_CARE_PROVIDER_SITE_OTHER): Payer: 59 | Admitting: Family Medicine

## 2023-12-25 ENCOUNTER — Encounter: Payer: Self-pay | Admitting: Family Medicine

## 2023-12-25 VITALS — BP 125/72 | HR 82 | Temp 97.7°F | Ht 68.0 in | Wt 277.0 lb

## 2023-12-25 DIAGNOSIS — E1165 Type 2 diabetes mellitus with hyperglycemia: Secondary | ICD-10-CM

## 2023-12-25 DIAGNOSIS — Z7984 Long term (current) use of oral hypoglycemic drugs: Secondary | ICD-10-CM

## 2023-12-25 DIAGNOSIS — E7800 Pure hypercholesterolemia, unspecified: Secondary | ICD-10-CM

## 2023-12-25 DIAGNOSIS — R030 Elevated blood-pressure reading, without diagnosis of hypertension: Secondary | ICD-10-CM

## 2023-12-25 LAB — BAYER DCA HB A1C WAIVED: HB A1C (BAYER DCA - WAIVED): 5.8 % — ABNORMAL HIGH (ref 4.8–5.6)

## 2023-12-25 LAB — LIPID PANEL

## 2023-12-25 MED ORDER — PREGABALIN 150 MG PO CAPS: 150.0000 mg | ORAL_CAPSULE | Freq: Every day | ORAL | 3 refills | Status: AC

## 2023-12-25 MED ORDER — TIZANIDINE HCL 4 MG PO TABS: 4.0000 mg | ORAL_TABLET | Freq: Four times a day (QID) | ORAL | 5 refills | Status: AC | PRN

## 2023-12-25 NOTE — Progress Notes (Signed)
 Subjective:  Patient ID: Margaret Wright, female    DOB: 1976-07-19  Age: 48 y.o. MRN: [id number]  CC: Medication Refill (pended) and Spasms (Muscle spasms are much worse. Would like muscle relaxer dosage upped if possible.)   HPI Margaret Wright presents forFollow-up of diabetes. Patient checks blood sugar at home.  Patient denies symptoms such as polyuria, polydipsia, excessive hunger, nausea No significant hypoglycemic spells noted. Medications reviewed. Pt reports taking them regularly without complication/adverse reaction being reported today.   Had back spasms starting 2 days before Christmas. Still catches at times. Moving pretty well now. Had some constipation that has resolved.   History Margaret Wright has a past medical history of Anxiety, Diabetes mellitus without complication (HCC), Migraines, and Sciatica.   She has a past surgical history that includes Tonsillectomy; Appendectomy; Cholecystectomy; orthopedic surgery; and Novasure ablation.   Her family history includes Diabetes in her mother; Epilepsy in her son; Heart disease in her father; Lung disease in her father.She reports that she has been smoking cigarettes. She has a 15 pack-year smoking history. She has never used smokeless tobacco. She reports current alcohol use. She reports current drug use. Frequency: 3.00 times per week. Drug: Marijuana.  Current Outpatient Medications on File Prior to Visit  Medication Sig Dispense Refill   blood glucose meter kit and supplies KIT Dispense based on patient and insurance preference. Use up to four times daily as directed. (FOR ICD-10 : E11.9 1 each PRN   diclofenac  (FLECTOR ) 1.3 % PTCH Place 1 patch onto the skin 2 (two) times daily. 5 patch 1   metFORMIN  (GLUCOPHAGE -XR) 500 MG 24 hr tablet Take 1 tablet (500 mg total) by mouth daily with breakfast. 90 tablet 3   ondansetron  (ZOFRAN -ODT) 8 MG disintegrating tablet Take 1 tablet (8 mg total) by mouth every 6 (six) hours as  needed for nausea or vomiting. 20 tablet 1   rosuvastatin  (CRESTOR ) 5 MG tablet Take 1 tablet (5 mg total) by mouth daily. For cholesterol 90 tablet 3   Vitamin D , Ergocalciferol , (DRISDOL ) 1.25 MG (50000 UNIT) CAPS capsule Take 1 capsule (50,000 Units total) by mouth every 7 (seven) days. 13 capsule 3   diclofenac  (VOLTAREN ) 75 MG EC tablet Take 1 tablet (75 mg total) by mouth 2 (two) times daily. For muscle and  Joint pain (Patient not taking: Reported on 12/25/2023) 60 tablet 2   No current facility-administered medications on file prior to visit.    ROS Review of Systems  Constitutional: Negative.   HENT: Negative.    Eyes:  Negative for visual disturbance.  Respiratory:  Negative for shortness of breath.   Cardiovascular:  Negative for chest pain.  Gastrointestinal:  Negative for abdominal pain.  Musculoskeletal:  Positive for back pain. Negative for arthralgias.    Objective:  BP 125/72   Pulse 82   Temp 97.7 F (36.5 C) (Temporal)   Ht 5' 8 (1.727 m)   Wt 277 lb (125.6 kg)   SpO2 95%   BMI 42.12 kg/m   BP Readings from Last 3 Encounters:  12/25/23 125/72  12/02/23 126/88  05/23/23 117/77    Wt Readings from Last 3 Encounters:  12/25/23 277 lb (125.6 kg)  12/02/23 263 lb (119.3 kg)  05/23/23 277 lb 12.8 oz (126 kg)     Physical Exam Constitutional:      General: She is not in acute distress.    Appearance: She is well-developed.  Cardiovascular:     Rate and Rhythm: Normal rate  and regular rhythm.  Pulmonary:     Breath sounds: Normal breath sounds.  Musculoskeletal:        General: Tenderness (Midline lumbar) present. Normal range of motion.  Skin:    General: Skin is warm and dry.  Neurological:     Mental Status: She is alert and oriented to person, place, and time.       Assessment & Plan:   Kebrina Friend was seen today for medication refill and spasms.  Diagnoses and all orders for this visit:  Type 2 diabetes mellitus with  hyperglycemia, without long-term current use of insulin (HCC) -     Bayer DCA Hb A1c Waived -     Microalbumin/Creatinine Ratio, Urine -     Lipid panel  Diabetic hypoglycemia (HCC) -     Bayer DCA Hb A1c Waived -     Microalbumin/Creatinine Ratio, Urine -     Lipid panel  Elevated LDL cholesterol level -     CMP14+EGFR -     Lipid panel  Elevated BP without diagnosis of hypertension -     CBC with Differential/Platelet  Morbid obesity (HCC) -     CBC with Differential/Platelet -     CMP14+EGFR  Other orders -     pregabalin  (LYRICA ) 150 MG capsule; Take 1 capsule (150 mg total) by mouth daily. -     tiZANidine  (ZANAFLEX ) 4 MG tablet; Take 1 tablet (4 mg total) by mouth every 6 (six) hours as needed for muscle spasms.      I have discontinued Rayana P. Dearden Kim's predniSONE . I have also changed her tiZANidine . Additionally, I am having her maintain her Vitamin D  (Ergocalciferol ), blood glucose meter kit and supplies, diclofenac , metFORMIN , ondansetron , rosuvastatin , diclofenac , and pregabalin .  Meds ordered this encounter  Medications   pregabalin  (LYRICA ) 150 MG capsule    Sig: Take 1 capsule (150 mg total) by mouth daily.    Dispense:  90 capsule    Refill:  3   tiZANidine  (ZANAFLEX ) 4 MG tablet    Sig: Take 1 tablet (4 mg total) by mouth every 6 (six) hours as needed for muscle spasms.    Dispense:  30 tablet    Refill:  5     Follow-up: No follow-ups on file.  Butler Der, M.D.

## 2023-12-26 LAB — CBC WITH DIFFERENTIAL/PLATELET
Basophils Absolute: 0.1 10*3/uL (ref 0.0–0.2)
Basos: 1 %
EOS (ABSOLUTE): 0.2 10*3/uL (ref 0.0–0.4)
Eos: 2 %
Hematocrit: 46.6 % (ref 34.0–46.6)
Hemoglobin: 15.5 g/dL (ref 11.1–15.9)
Immature Grans (Abs): 0 10*3/uL (ref 0.0–0.1)
Immature Granulocytes: 0 %
Lymphocytes Absolute: 1.7 10*3/uL (ref 0.7–3.1)
Lymphs: 17 %
MCH: 31.8 pg (ref 26.6–33.0)
MCHC: 33.3 g/dL (ref 31.5–35.7)
MCV: 96 fL (ref 79–97)
Monocytes Absolute: 0.6 10*3/uL (ref 0.1–0.9)
Monocytes: 6 %
Neutrophils Absolute: 7.5 10*3/uL — ABNORMAL HIGH (ref 1.4–7.0)
Neutrophils: 74 %
Platelets: 234 10*3/uL (ref 150–450)
RBC: 4.88 x10E6/uL (ref 3.77–5.28)
RDW: 11.7 % (ref 11.7–15.4)
WBC: 10.1 10*3/uL (ref 3.4–10.8)

## 2023-12-26 LAB — CMP14+EGFR
ALT: 20 IU/L (ref 0–32)
AST: 22 IU/L (ref 0–40)
Albumin: 4 g/dL (ref 3.9–4.9)
Alkaline Phosphatase: 77 IU/L (ref 44–121)
BUN/Creatinine Ratio: 15 (ref 9–23)
BUN: 10 mg/dL (ref 6–24)
Bilirubin Total: 0.3 mg/dL (ref 0.0–1.2)
CO2: 24 mmol/L (ref 20–29)
Calcium: 9.7 mg/dL (ref 8.7–10.2)
Chloride: 104 mmol/L (ref 96–106)
Creatinine, Ser: 0.68 mg/dL (ref 0.57–1.00)
Globulin, Total: 2.7 g/dL (ref 1.5–4.5)
Glucose: 117 mg/dL — ABNORMAL HIGH (ref 70–99)
Potassium: 4.2 mmol/L (ref 3.5–5.2)
Sodium: 141 mmol/L (ref 134–144)
Total Protein: 6.7 g/dL (ref 6.0–8.5)
eGFR: 107 mL/min/{1.73_m2} (ref >59)

## 2023-12-26 LAB — LIPID PANEL
Cholesterol, Total: 212 mg/dL — ABNORMAL HIGH (ref 100–199)
HDL: 65 mg/dL (ref >39)
LDL CALC COMMENT:: 3.3 ratio (ref 0.0–4.4)
LDL Chol Calc (NIH): 123 mg/dL — ABNORMAL HIGH (ref 0–99)
Triglycerides: 135 mg/dL (ref 0–149)
VLDL Cholesterol Cal: 24 mg/dL (ref 5–40)

## 2023-12-26 LAB — MICROALBUMIN / CREATININE URINE RATIO
Creatinine, Urine: 80.1 mg/dL
Microalb/Creat Ratio: <4 mg/g{creat} (ref 0–29)
Microalbumin, Urine: <3 ug/mL

## 2023-12-31 ENCOUNTER — Encounter: Payer: Self-pay | Admitting: Family Medicine

## 2024-01-01 ENCOUNTER — Ambulatory Visit (INDEPENDENT_AMBULATORY_CARE_PROVIDER_SITE_OTHER): Payer: 59 | Admitting: Family Medicine

## 2024-01-01 ENCOUNTER — Encounter: Payer: Self-pay | Admitting: Family Medicine

## 2024-01-01 VITALS — BP 121/73 | HR 79 | Temp 97.4°F | Ht 68.0 in | Wt 275.4 lb

## 2024-01-01 DIAGNOSIS — M5431 Sciatica, right side: Secondary | ICD-10-CM | POA: Diagnosis not present

## 2024-01-01 MED ORDER — PREDNISONE 10 MG PO TABS: ORAL_TABLET | ORAL | 0 refills | Status: AC

## 2024-01-01 MED ORDER — POLYETHYLENE GLYCOL 3350 17 GM/SCOOP PO POWD: 17.0000 g | Freq: Two times a day (BID) | ORAL | 1 refills | Status: AC | PRN

## 2024-01-01 NOTE — Progress Notes (Signed)
Subjective:  Patient ID: Margaret Wright, female    DOB: [birth year]  Age: 48 y.o. MRN: [id number]  CC: Back Pain (Not any better)   HPI Margaret Wright presents for worsening back pain. No relief with the muscle relaxer offered last week. Pain is moderate 5/10 at rest. More severe with movement, 8/10. .     12/25/2023    1:32 PM 05/23/2023    2:40 PM 05/23/2023    2:21 PM  Depression screen PHQ 2/9  Decreased Interest 1 1 0  Down, Depressed, Hopeless 1 1 0  PHQ - 2 Score 2 2 0  Altered sleeping 2 2   Tired, decreased energy 2 2   Change in appetite 1 2   Feeling bad or failure about yourself  1 2   Trouble concentrating 2 3   Moving slowly or fidgety/restless 2 0   Suicidal thoughts 1 1   PHQ-9 Score 13 14   Difficult doing work/chores Somewhat difficult Not difficult at all     History Margaret Wright has a past medical history of Anxiety, Diabetes mellitus without complication (HCC), Migraines, and Sciatica.   She has a past surgical history that includes Tonsillectomy; Appendectomy; Cholecystectomy; orthopedic surgery; and Novasure ablation.   Her family history includes Diabetes in her mother; Epilepsy in her son; Heart disease in her father; Lung disease in her father.She reports that she has been smoking cigarettes. She has a 15 pack-year smoking history. She has never used smokeless tobacco. She reports current alcohol use. She reports current drug use. Frequency: 3.00 times per week. Drug: Marijuana.    ROS Review of Systems  Objective:  BP 121/73   Pulse 79   Temp (!) 97.4 F (36.3 C) (Temporal)   Ht 5\' 8"  (1.727 m)   Wt 275 lb 6.4 oz (124.9 kg)   SpO2 98%   BMI 41.87 kg/m   BP Readings from Last 3 Encounters:  01/01/24 121/73  12/25/23 125/72  12/02/23 126/88    Wt Readings from Last 3 Encounters:  01/01/24 275 lb 6.4 oz (124.9 kg)  12/25/23 277 lb (125.6 kg)  12/02/23 263 lb (119.3 kg)     Physical Exam Musculoskeletal:        General: Tenderness (at  lateral aspet of right hip, posteriorly.) present.       Assessment & Plan:   Margaret Wright was seen today for back pain.  Diagnoses and all orders for this visit:  Sciatica of right side  Other orders -     predniSONE (DELTASONE) 10 MG tablet; Take 5 daily for 3 days followed by 4,3,2 and 1 for 3 days each. -     polyethylene glycol powder (GLYCOLAX/MIRALAX) 17 GM/SCOOP powder; Take 17 g by mouth 2 (two) times daily as needed for moderate constipation.       I am having Margaret Wright start on predniSONE and polyethylene glycol powder. I am also having her maintain her Vitamin D (Ergocalciferol), blood glucose meter kit and supplies, diclofenac, metFORMIN, ondansetron, rosuvastatin, diclofenac, pregabalin, and tiZANidine.  Allergies as of 01/01/2024       Reactions   Aspirin Nausea And Vomiting   Morphine And Codeine Other (See Comments)   Personality change        Medication List        Accurate as of January 01, 2024  9:54 PM. If you have any questions, ask your nurse or doctor.          blood glucose meter  kit and supplies Kit Dispense based on patient and insurance preference. Use up to four times daily as directed. (FOR ICD-10 : E11.9   diclofenac 1.3 % Ptch Commonly known as: FLECTOR Place 1 patch onto the skin 2 (two) times daily.   diclofenac 75 MG EC tablet Commonly known as: VOLTAREN Take 1 tablet (75 mg total) by mouth 2 (two) times daily. For muscle and  Joint pain   metFORMIN 500 MG 24 hr tablet Commonly known as: GLUCOPHAGE-XR Take 1 tablet (500 mg total) by mouth daily with breakfast.   ondansetron 8 MG disintegrating tablet Commonly known as: ZOFRAN-ODT Take 1 tablet (8 mg total) by mouth every 6 (six) hours as needed for nausea or vomiting.   polyethylene glycol powder 17 GM/SCOOP powder Commonly known as: GLYCOLAX/MIRALAX Take 17 g by mouth 2 (two) times daily as needed for moderate constipation. Started by: Emmett Bracknell   predniSONE 10  MG tablet Commonly known as: DELTASONE Take 5 daily for 3 days followed by 4,3,2 and 1 for 3 days each. Started by: Casimiro Lienhard   pregabalin 150 MG capsule Commonly known as: Lyrica Take 1 capsule (150 mg total) by mouth daily.   rosuvastatin 5 MG tablet Commonly known as: Crestor Take 1 tablet (5 mg total) by mouth daily. For cholesterol   tiZANidine 4 MG tablet Commonly known as: ZANAFLEX Take 1 tablet (4 mg total) by mouth every 6 (six) hours as needed for muscle spasms.   Vitamin D (Ergocalciferol) 1.25 MG (50000 UNIT) Caps capsule Commonly known as: DRISDOL Take 1 capsule (50,000 Units total) by mouth every 7 (seven) days.         Follow-up: Return if symptoms worsen or fail to improve.  Mechele Claude, M.D.

## 2024-01-01 NOTE — Progress Notes (Unsigned)
Subjective:  Patient ID: Margaret Wright, female    DOB: 11/02/1976  Age: 48 y.o. MRN: [id number]  CC: No chief complaint on file.   HPI AMYRACLE SCELSI presents for ***     12/25/2023    1:32 PM 05/23/2023    2:40 PM 05/23/2023    2:21 PM  Depression screen PHQ 2/9  Decreased Interest 1 1 0  Down, Depressed, Hopeless 1 1 0  PHQ - 2 Score 2 2 0  Altered sleeping 2 2   Tired, decreased energy 2 2   Change in appetite 1 2   Feeling bad or failure about yourself  1 2   Trouble concentrating 2 3   Moving slowly or fidgety/restless 2 0   Suicidal thoughts 1 1   PHQ-9 Score 13 14   Difficult doing work/chores Somewhat difficult Not difficult at all     History [name] has a past medical history of Anxiety, Diabetes mellitus without complication (HCC), Migraines, and Sciatica.   She has a past surgical history that includes Tonsillectomy; Appendectomy; Cholecystectomy; orthopedic surgery; and Novasure ablation.   Her family history includes Diabetes in her mother; Epilepsy in her son; Heart disease in her father; Lung disease in her father.She reports that she has been smoking cigarettes. She has a 15 pack-year smoking history. She has never used smokeless tobacco. She reports current alcohol use. She reports current drug use. Frequency: 3.00 times per week. Drug: Marijuana.    ROS Review of Systems  Objective:  There were no vitals taken for this visit.  BP Readings from Last 3 Encounters:  01/01/24 121/73  12/25/23 125/72  12/02/23 126/88    Wt Readings from Last 3 Encounters:  01/01/24 275 lb 6.4 oz (124.9 kg)  12/25/23 277 lb (125.6 kg)  12/02/23 263 lb (119.3 kg)     Physical Exam    Assessment & Plan:   There are no diagnoses linked to this encounter.     I am having Margaret Wright maintain her Vitamin D (Ergocalciferol), blood glucose meter kit and supplies, diclofenac, metFORMIN, ondansetron, rosuvastatin, diclofenac, pregabalin, and  tiZANidine.  Allergies as of 01/01/2024       Reactions   Aspirin Nausea And Vomiting   Morphine And Codeine Other (See Comments)   Personality change        Medication List        Accurate as of January 01, 2024  3:47 PM. If you have any questions, ask your nurse or doctor.          blood glucose meter kit and supplies Kit Dispense based on patient and insurance preference. Use up to four times daily as directed. (FOR ICD-10 : E11.9   diclofenac 1.3 % Ptch Commonly known as: FLECTOR Place 1 patch onto the skin 2 (two) times daily.   diclofenac 75 MG EC tablet Commonly known as: VOLTAREN Take 1 tablet (75 mg total) by mouth 2 (two) times daily. For muscle and  Joint pain   metFORMIN 500 MG 24 hr tablet Commonly known as: GLUCOPHAGE-XR Take 1 tablet (500 mg total) by mouth daily with breakfast.   ondansetron 8 MG disintegrating tablet Commonly known as: ZOFRAN-ODT Take 1 tablet (8 mg total) by mouth every 6 (six) hours as needed for nausea or vomiting.   pregabalin 150 MG capsule Commonly known as: Lyrica Take 1 capsule (150 mg total) by mouth daily.   rosuvastatin 5 MG tablet Commonly known as: Crestor Take 1 tablet (5  mg total) by mouth daily. For cholesterol   tiZANidine 4 MG tablet Commonly known as: ZANAFLEX Take 1 tablet (4 mg total) by mouth every 6 (six) hours as needed for muscle spasms.   Vitamin D (Ergocalciferol) 1.25 MG (50000 UNIT) Caps capsule Commonly known as: DRISDOL Take 1 capsule (50,000 Units total) by mouth every 7 (seven) days.         Follow-up: No follow-ups on file.  Mechele Claude, M.D.
